# Patient Record
Sex: Female | Born: 1995 | Race: White | Hispanic: No | Marital: Single | Smoking: Current every day smoker
Health system: Southern US, Community
[De-identification: ages and names within clinical notes are randomized; demographics above are authoritative.]

## PROBLEM LIST (undated history)

## (undated) ENCOUNTER — Inpatient Hospital Stay: Payer: Self-pay

## (undated) DIAGNOSIS — R51 Headache: Secondary | ICD-10-CM

## (undated) DIAGNOSIS — N12 Tubulo-interstitial nephritis, not specified as acute or chronic: Secondary | ICD-10-CM

## (undated) DIAGNOSIS — R519 Headache, unspecified: Secondary | ICD-10-CM

## (undated) DIAGNOSIS — O4440 Low lying placenta NOS or without hemorrhage, unspecified trimester: Secondary | ICD-10-CM

## (undated) HISTORY — DX: Low lying placenta nos or without hemorrhage, unspecified trimester: O44.40

## (undated) HISTORY — PX: NO PAST SURGERIES: SHX2092

---
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
## 2017-01-14 ENCOUNTER — Ambulatory Visit (INDEPENDENT_AMBULATORY_CARE_PROVIDER_SITE_OTHER): Payer: BLUE CROSS/BLUE SHIELD | Admitting: Obstetrics and Gynecology

## 2017-01-14 ENCOUNTER — Encounter: Payer: Self-pay | Admitting: Obstetrics and Gynecology

## 2017-01-14 VITALS — BP 118/70 | Ht 60.0 in | Wt 212.0 lb

## 2017-01-14 DIAGNOSIS — Z3046 Encounter for surveillance of implantable subdermal contraceptive: Secondary | ICD-10-CM | POA: Diagnosis not present

## 2017-01-14 NOTE — Progress Notes (Signed)
  GYNECOLOGY PROCEDURE NOTE  Nexplanon removal discussed in detail.  Risks of infection, bleeding, nerve injury all reviewed.  Patient understands risks and desires to proceed.  Verbal consent obtained.  Patient is certain she wants the implanon removed.  All questions answered.  Procedure: Patient placed in dorsal supine with left arm above head, elbow flexed at 90 degrees, arm resting on examination table.  Implanon identified without problems.  Betadine scrub x3.  1 ml of 1% lidocaine injected under Nexplanon device without problems.  Sterile gloves applied.  Small 0.5cm incision made at distal tip of Nexplanon device with 11 blade scalpel.  Nexplanon brought to incision and grasped with a small kelly clamp.  Nexplanon removed intact without problems.  Pressure applied to incision.  Hemostasis obtained.  Steri-strips applied, followed by bandage and compression dressing.  Patient tolerated procedure well.  No complications.   Assessment: 21 y.o. year old female now s/p uncomplicated Nexplanon removal.  Plan: 1.  Patient given post procedure precautions and asked to call for fever, chills, redness or drainage from her incision, bleeding from incision.  She understands she will likely have a small bruise near site of removal and can remove bandage tomorrow and steri-strips in approximately 1 week.  2) Contraception: none. Desires pregnancy.  Discussed PNV and self-care to prepare for pregnancy.  Thomasene MohairStephen Jackson, MD 01/14/2017 12:13 PM

## 2017-04-11 ENCOUNTER — Encounter: Payer: Self-pay | Admitting: Emergency Medicine

## 2017-04-11 ENCOUNTER — Emergency Department
Admission: EM | Admit: 2017-04-11 | Discharge: 2017-04-11 | Disposition: A | Discharge status: Home / Self Care | Payer: BLUE CROSS/BLUE SHIELD | Attending: Emergency Medicine | Admitting: Emergency Medicine

## 2017-04-11 ENCOUNTER — Emergency Department: Payer: BLUE CROSS/BLUE SHIELD

## 2017-04-11 DIAGNOSIS — Y92410 Unspecified street and highway as the place of occurrence of the external cause: Secondary | ICD-10-CM | POA: Insufficient documentation

## 2017-04-11 DIAGNOSIS — Z3A00 Weeks of gestation of pregnancy not specified: Secondary | ICD-10-CM | POA: Insufficient documentation

## 2017-04-11 DIAGNOSIS — Y998 Other external cause status: Secondary | ICD-10-CM | POA: Insufficient documentation

## 2017-04-11 DIAGNOSIS — Z87891 Personal history of nicotine dependence: Secondary | ICD-10-CM | POA: Diagnosis not present

## 2017-04-11 DIAGNOSIS — M7918 Myalgia, other site: Secondary | ICD-10-CM | POA: Diagnosis not present

## 2017-04-11 DIAGNOSIS — Z3491 Encounter for supervision of normal pregnancy, unspecified, first trimester: Secondary | ICD-10-CM

## 2017-04-11 DIAGNOSIS — R51 Headache: Secondary | ICD-10-CM | POA: Diagnosis not present

## 2017-04-11 DIAGNOSIS — S161XXA Strain of muscle, fascia and tendon at neck level, initial encounter: Secondary | ICD-10-CM | POA: Diagnosis not present

## 2017-04-11 DIAGNOSIS — S199XXA Unspecified injury of neck, initial encounter: Secondary | ICD-10-CM | POA: Diagnosis present

## 2017-04-11 DIAGNOSIS — O9A219 Injury, poisoning and certain other consequences of external causes complicating pregnancy, unspecified trimester: Secondary | ICD-10-CM | POA: Diagnosis not present

## 2017-04-11 DIAGNOSIS — Y939 Activity, unspecified: Secondary | ICD-10-CM | POA: Diagnosis not present

## 2017-04-11 DIAGNOSIS — Z3201 Encounter for pregnancy test, result positive: Secondary | ICD-10-CM | POA: Insufficient documentation

## 2017-04-11 LAB — HCG, QUANTITATIVE, PREGNANCY: hCG, Beta Chain, Quant, S: 181 m[IU]/mL — ABNORMAL HIGH (ref <5)

## 2017-04-11 LAB — POCT PREGNANCY, URINE: PREG TEST UR: POSITIVE — AB

## 2017-04-11 NOTE — Discharge Instructions (Signed)
Due to early gestation advised only extra strength Tylenol for pain.
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
## 2017-04-18 ENCOUNTER — Encounter: Payer: BLUE CROSS/BLUE SHIELD | Admitting: Advanced Practice Midwife

## 2017-04-28 ENCOUNTER — Ambulatory Visit (INDEPENDENT_AMBULATORY_CARE_PROVIDER_SITE_OTHER): Payer: BLUE CROSS/BLUE SHIELD | Admitting: Advanced Practice Midwife

## 2017-04-28 ENCOUNTER — Encounter: Payer: Self-pay | Admitting: Advanced Practice Midwife

## 2017-04-28 VITALS — BP 110/72 | HR 85 | Wt 221.0 lb

## 2017-04-28 DIAGNOSIS — Z113 Encounter for screening for infections with a predominantly sexual mode of transmission: Secondary | ICD-10-CM

## 2017-04-28 DIAGNOSIS — O99210 Obesity complicating pregnancy, unspecified trimester: Secondary | ICD-10-CM

## 2017-04-28 DIAGNOSIS — Z1379 Encounter for other screening for genetic and chromosomal anomalies: Secondary | ICD-10-CM

## 2017-04-28 DIAGNOSIS — O0990 Supervision of high risk pregnancy, unspecified, unspecified trimester: Secondary | ICD-10-CM | POA: Insufficient documentation

## 2017-04-28 DIAGNOSIS — Z6841 Body Mass Index (BMI) 40.0 and over, adult: Secondary | ICD-10-CM

## 2017-04-28 NOTE — Progress Notes (Signed)
New Obstetric Patient H&P    Chief Complaint: "Desires prenatal care"   History of Present Illness: Patient is a 21 y.o. G2P1001 Not Hispanic or Latino female, LMP 03/14/2017 presents with amenorrhea and positive home pregnancy test. Based on her  LMP, her EDD is Estimated Date of Delivery: 12/19/2017. and her EGA is 3333w3d. Cycles are 6. days, regular, and occur approximately every : 28 days. She has never had a PAP smear.   She had a urine pregnancy test which was positive 2 or 3 week(s)  ago. Her last menstrual period was normal and lasted for  5 or 6 day(s). Since her LMP she claims she has experienced breast tenderness, fatigue. She denies vaginal bleeding. Her past medical history is contributory. Her prior pregnancies are notable for no complications. She gained 85 pounds with G1  Since her LMP, she admits to the use of tobacco products  no She claims she has gained   9 pounds since the start of her pregnancy.  There are cats in the home in the home  no  She admits close contact with children on a regular basis  yes  She has had chicken pox in the past yes She has had Tuberculosis exposures, symptoms, or previously tested positive for TB   no Current or past history of domestic violence. no  Genetic Screening/Teratology Counseling: (Includes patient, baby's father, or anyone in either family with:)   1. [facility]  no 2. Thalassemia ([place], [place], Mediterranean, or Asian background): MCV<80  no 3. Neural tube defect (meningomyelocele, spina bifida, anencephaly)  no 4. Congenital heart defect  no  5. Down syndrome  no 6. Tay-Sachs (Jewish, [place])  no 7. Canavan's Disease  no 8. Sickle cell disease or trait (African)  no  9. Hemophilia or other blood disorders  no  10. Muscular dystrophy  no  11. Cystic fibrosis  no  12. Huntington's Chorea  no  13. Mental retardation/autism  no 14. Other inherited genetic or chromosomal disorder  no 15. Maternal  metabolic disorder (DM, PKU, etc)  no 16. Patient or FOB with a child with a birth defect not listed above no  16a. Patient or FOB with a birth defect themselves no 17. Recurrent pregnancy loss, or stillbirth  no  18. Any medications since LMP other than prenatal vitamins (include vitamins, supplements, OTC meds, drugs, alcohol)  no 19. Any other genetic/environmental exposure to discuss  no  Infection History:   1. Lives with someone with TB or TB exposed  no  2. Patient or partner has history of genital herpes  no 3. Rash or viral illness since LMP  no 4. History of STI (GC, CT, HPV, syphilis, HIV)  no 5. History of recent travel :  no  Other pertinent information:  no     Review of Systems:10 point review of systems negative unless otherwise noted in HPI  Past Medical History:  Past Medical History:  Diagnosis Date  . Patient denies medical problems     Past Surgical History:  Past Surgical History:  Procedure Laterality Date  . NO PAST SURGERIES      Gynecologic History: Patient's last menstrual period was 03/14/2017 (exact date).  Obstetric History: G2P1001  Family History:  Family History  Problem Relation Age of Onset  . Diabetes Maternal Uncle   . Breast cancer Other     Social History:  Social History   Social History  . Marital status: Single  Spouse name: N/A  . Number of children: N/A  . Years of education: N/A   Occupational History  . Not on file.   Social History Main Topics  . Smoking status: Former Games developer  . Smokeless tobacco: Never Used  . Alcohol use No  . Drug use: No  . Sexual activity: Yes   Other Topics Concern  . Not on file   Social History Narrative  . No narrative on file    Allergies:  No Known Allergies  Medications: Prior to Admission medications   Medication Sig Start Date End Date Taking? Authorizing Provider  acetaminophen (TYLENOL) 325 MG tablet Take 2 tablets (650 mg total) by mouth every 6 (six) hours as  needed for mild pain or fever (or Fever >/= 101). Patient not taking: Reported on 01/14/2017 07/07/16   Altamese Dilling, MD  cefUROXime (CEFTIN) 250 MG tablet Take 1 tablet (250 mg total) by mouth 2 (two) times daily with a meal. Patient not taking: Reported on 01/14/2017 07/07/16   Altamese Dilling, MD    Physical Exam Vitals: Blood pressure 110/72, pulse 85, weight 221 lb (100.2 kg), last menstrual period 03/14/2017.  General: NAD HEENT: normocephalic, anicteric Thyroid: no enlargement, no palpable nodules Pulmonary: No increased work of breathing, CTAB Cardiovascular: RRR, distal pulses 2+ Abdomen: NABS, soft, non-tender, non-distended.  Umbilicus without lesions.  No hepatomegaly, splenomegaly or masses palpable. No evidence of hernia  Genitourinary:  External: Normal external female genitalia.  Normal urethral meatus, normal  Bartholin's and Skene's glands.    Vagina: Normal vaginal mucosa, no evidence of prolapse.    Cervix: Grossly normal in appearance, no bleeding, no CMT  Uterus: Enlarged, mobile, normal contour.    Adnexa: ovaries non-enlarged, no adnexal masses  Rectal: deferred Extremities: no edema, erythema, or tenderness Neurologic: Grossly intact Psychiatric: mood appropriate, affect full   Assessment: 21 y.o. G2P1001 at Unknown presenting to initiate prenatal care  Plan: 1) Avoid alcoholic beverages. 2) Patient encouraged not to smoke.  3) Discontinue the use of all non-medicinal drugs and chemicals.  4) Take prenatal vitamins daily.  5) Nutrition, food safety (fish, cheese advisories, and high nitrite foods) and exercise discussed. 6) Hospital and practice style discussed with cross coverage system.  7) Genetic Screening, such as with 1st Trimester Screening, cell free fetal DNA, AFP testing, and Ultrasound, as well as with amniocentesis and CVS as appropriate, is discussed with patient. At the conclusion of today's visit patient requested genetic  testing 8) Patient is asked about travel to areas at risk for the Zika virus, and counseled to avoid travel and exposure to mosquitoes or sexual partners who may have themselves been exposed to the virus. Testing is discussed, and will be ordered as appropriate.   Tresea Mall, CNM

## 2017-04-28 NOTE — Patient Instructions (Signed)

## 2017-04-28 NOTE — Progress Notes (Signed)
NOB 

## 2017-05-03 LAB — URINE CULTURE

## 2017-05-03 LAB — GC/CHLAMYDIA PROBE AMP
Chlamydia trachomatis, NAA: NEGATIVE
NEISSERIA GONORRHOEAE BY PCR: NEGATIVE

## 2017-05-06 ENCOUNTER — Other Ambulatory Visit: Payer: Self-pay | Admitting: Advanced Practice Midwife

## 2017-05-06 ENCOUNTER — Encounter: Payer: BLUE CROSS/BLUE SHIELD | Admitting: Obstetrics & Gynecology

## 2017-05-06 ENCOUNTER — Other Ambulatory Visit: Payer: BLUE CROSS/BLUE SHIELD

## 2017-05-06 MED ORDER — NITROFURANTOIN MONOHYD MACRO 100 MG PO CAPS: 100.0000 mg | ORAL_CAPSULE | Freq: Two times a day (BID) | ORAL | 0 refills | Status: DC

## 2017-05-06 NOTE — Progress Notes (Signed)
Rx sent to pharmacy to treat UTI Klebsiella Oxytoca >100k

## 2017-05-12 ENCOUNTER — Other Ambulatory Visit: Payer: BLUE CROSS/BLUE SHIELD

## 2017-05-12 ENCOUNTER — Ambulatory Visit (INDEPENDENT_AMBULATORY_CARE_PROVIDER_SITE_OTHER): Payer: BLUE CROSS/BLUE SHIELD

## 2017-05-12 ENCOUNTER — Encounter: Payer: Self-pay | Admitting: Advanced Practice Midwife

## 2017-05-12 ENCOUNTER — Ambulatory Visit (INDEPENDENT_AMBULATORY_CARE_PROVIDER_SITE_OTHER): Payer: BLUE CROSS/BLUE SHIELD | Admitting: Advanced Practice Midwife

## 2017-05-12 VITALS — BP 118/70 | Wt 218.0 lb

## 2017-05-12 DIAGNOSIS — O0990 Supervision of high risk pregnancy, unspecified, unspecified trimester: Secondary | ICD-10-CM

## 2017-05-12 DIAGNOSIS — Z1379 Encounter for other screening for genetic and chromosomal anomalies: Secondary | ICD-10-CM

## 2017-05-12 DIAGNOSIS — Z3A08 8 weeks gestation of pregnancy: Secondary | ICD-10-CM

## 2017-05-12 DIAGNOSIS — Z362 Encounter for other antenatal screening follow-up: Secondary | ICD-10-CM | POA: Diagnosis not present

## 2017-05-12 DIAGNOSIS — Z6841 Body Mass Index (BMI) 40.0 and over, adult: Secondary | ICD-10-CM

## 2017-05-12 DIAGNOSIS — Z113 Encounter for screening for infections with a predominantly sexual mode of transmission: Secondary | ICD-10-CM

## 2017-05-12 DIAGNOSIS — O99210 Obesity complicating pregnancy, unspecified trimester: Secondary | ICD-10-CM

## 2017-05-12 NOTE — Progress Notes (Signed)
  Routine Prenatal Care Visit  Subjective  Nestor Ramplicia D Pun is a 21 y.o. G2P1001 at Unknown being seen today for ongoing prenatal care.  She is currently monitored for the following issues for this high-risk pregnancy and has Sepsis (HCC); Pyelonephritis; Supervision of high risk pregnancy, antepartum; Obesity affecting pregnancy, antepartum; and BMI 40.0-44.9, adult (HCC) on their problem list.  ----------------------------------------------------------------------------------- Patient reports nausea.  Comfort measures reviewed and sample of Bonjesta given.  . Vag. Bleeding: None.   . Denies leaking of fluid.  ----------------------------------------------------------------------------------- The following portions of the patient's history were reviewed and updated as appropriate: allergies, current medications, past family history, past medical history, past social history, past surgical history and problem list. Problem list updated.   Objective  Blood pressure 118/70, weight 218 lb (98.9 kg), last menstrual period 03/14/2017. Pregravid weight 212 lb (96.2 kg) Total Weight Gain 6 lb (2.722 kg) Urinalysis: Urine Protein: Negative Urine Glucose: Negative  Fetal Status:           General:  Alert, oriented and cooperative. Patient is in no acute distress.  Skin: Skin is warm and dry. No rash noted.   Cardiovascular: Normal heart rate noted  Respiratory: Normal respiratory effort, no problems with respiration noted  Abdomen: Soft, gravid, appropriate for gestational age.       Pelvic:  Cervical exam deferred        Extremities: Normal range of motion.     Mental Status: Normal mood and affect. Normal behavior. Normal judgment and thought content.   Assessment   21 y.o. G2P1001 at Unknown by  Not found. presenting for routine prenatal visit  Plan   Pregnancy #2 Problems (from 03/14/17 to present)    No problems associated with this episode.       Preterm labor symptoms and general  obstetric precautions including but not limited to vaginal bleeding, contractions, leaking of fluid and fetal movement were reviewed in detail with the patient. Please refer to After Visit Summary for other counseling recommendations.   Return in about 4 weeks (around 06/09/2017) for 1st trimester screen and rob.  Tresea MallJane Emilee Market, CNM  05/12/2017 10:12 AM

## 2017-05-12 NOTE — Progress Notes (Signed)
Dating scan today, early 1 hr gtt. No vb. No lof

## 2017-05-14 LAB — HEMOGLOBINOPATHY EVALUATION
HEMOGLOBIN A2 QUANTITATION: 2.4 % (ref 1.8–3.2)
HGB C: 0 %
HGB S: 0 %
HGB VARIANT: 0 %
Hemoglobin F Quantitation: 0 % (ref 0.0–2.0)
Hgb A: 97.6 % (ref 96.4–98.8)

## 2017-05-14 LAB — RPR+RH+ABO+RUB AB+AB SCR+CB...
ANTIBODY SCREEN: NEGATIVE
HEMATOCRIT: 39 % (ref 34.0–46.6)
HEMOGLOBIN: 13.4 g/dL (ref 11.1–15.9)
HEP B S AG: NEGATIVE
HIV SCREEN 4TH GENERATION: NONREACTIVE
MCH: 30.9 pg (ref 26.6–33.0)
MCHC: 34.4 g/dL (ref 31.5–35.7)
MCV: 90 fL (ref 79–97)
Platelets: 239 10*3/uL (ref 150–379)
RBC: 4.34 x10E6/uL (ref 3.77–5.28)
RDW: 13.9 % (ref 12.3–15.4)
RH TYPE: POSITIVE
RPR: NONREACTIVE
Rubella Antibodies, IGG: 3.29 index (ref >0.99)
Varicella zoster IgG: 555 index (ref >165)
WBC: 5.4 10*3/uL (ref 3.4–10.8)

## 2017-05-14 LAB — GLUCOSE, 1 HOUR GESTATIONAL: GESTATIONAL DIABETES SCREEN: 83 mg/dL (ref 65–139)

## 2017-06-02 ENCOUNTER — Ambulatory Visit (INDEPENDENT_AMBULATORY_CARE_PROVIDER_SITE_OTHER): Payer: BLUE CROSS/BLUE SHIELD | Admitting: Obstetrics & Gynecology

## 2017-06-02 ENCOUNTER — Encounter: Payer: Self-pay | Admitting: Obstetrics & Gynecology

## 2017-06-02 ENCOUNTER — Other Ambulatory Visit: Payer: BLUE CROSS/BLUE SHIELD

## 2017-06-02 VITALS — BP 120/70 | HR 79 | Ht 60.0 in | Wt 224.0 lb

## 2017-06-02 DIAGNOSIS — Z3A11 11 weeks gestation of pregnancy: Secondary | ICD-10-CM

## 2017-06-02 DIAGNOSIS — Z1379 Encounter for other screening for genetic and chromosomal anomalies: Secondary | ICD-10-CM

## 2017-06-02 NOTE — Progress Notes (Signed)
  Subjective  Fetal Movement? no Contractions? no Leaking Fluid? no Vaginal Bleeding? no  Objective  BP 120/70   Pulse 79   Ht 5' (1.524 m)   Wt 224 lb (101.6 kg)   LMP 03/14/2017 (Exact Date)   BMI 43.75 kg/m  General: NAD Pumonary: no increased work of breathing Abdomen: gravid, non-tender Extremities: no edema Psychiatric: mood appropriate, affect full No FHT today.  US this week Assessment  21 y.o. G2P1001 at 4976w3d by  12/19/2017, by Last Menstrual Period presenting for routine prenatal visit  Plan   Problem List Items Addressed This Visit    None    Visit Diagnoses    [redacted] weeks gestation of pregnancy    -  Primary   Encounter for genetic screening for Down Syndrome        Plan First Screen US and Labs this week (no staff today for it)  Annamarie MajorPaul Jolan Upchurch, MD, Merlinda FrederickFACOG [facility], [facility] 06/02/2017  4:36 PM

## 2017-06-05 ENCOUNTER — Other Ambulatory Visit: Payer: Self-pay | Admitting: Obstetrics & Gynecology

## 2017-06-05 DIAGNOSIS — Z3682 Encounter for antenatal screening for nuchal translucency: Secondary | ICD-10-CM

## 2017-06-09 ENCOUNTER — Other Ambulatory Visit: Payer: BLUE CROSS/BLUE SHIELD

## 2017-06-09 ENCOUNTER — Encounter: Payer: BLUE CROSS/BLUE SHIELD | Admitting: Maternal Newborn

## 2017-06-16 ENCOUNTER — Encounter: Payer: Self-pay | Admitting: Obstetrics and Gynecology

## 2017-06-16 ENCOUNTER — Ambulatory Visit (INDEPENDENT_AMBULATORY_CARE_PROVIDER_SITE_OTHER): Payer: BLUE CROSS/BLUE SHIELD | Admitting: Obstetrics and Gynecology

## 2017-06-16 ENCOUNTER — Ambulatory Visit (INDEPENDENT_AMBULATORY_CARE_PROVIDER_SITE_OTHER): Payer: BLUE CROSS/BLUE SHIELD

## 2017-06-16 VITALS — BP 122/80 | Wt 221.0 lb

## 2017-06-16 DIAGNOSIS — O0990 Supervision of high risk pregnancy, unspecified, unspecified trimester: Secondary | ICD-10-CM

## 2017-06-16 DIAGNOSIS — Z1379 Encounter for other screening for genetic and chromosomal anomalies: Secondary | ICD-10-CM

## 2017-06-16 DIAGNOSIS — Z3A13 13 weeks gestation of pregnancy: Secondary | ICD-10-CM

## 2017-06-16 DIAGNOSIS — Z6841 Body Mass Index (BMI) 40.0 and over, adult: Secondary | ICD-10-CM

## 2017-06-16 DIAGNOSIS — Z3682 Encounter for antenatal screening for nuchal translucency: Secondary | ICD-10-CM | POA: Diagnosis not present

## 2017-06-16 DIAGNOSIS — O99210 Obesity complicating pregnancy, unspecified trimester: Secondary | ICD-10-CM

## 2017-06-16 NOTE — Progress Notes (Signed)
  Routine Prenatal Care Visit Subjective  Amy Ware is a 21 y.o. G2P1001 at [redacted]w[redacted]d being seen today for ongoing prenatal care.  She is currently monitored for the following issues for this high-risk pregnancy and has Sepsis (HCC); Pyelonephritis; Supervision of high risk pregnancy, antepartum; Obesity affecting pregnancy, antepartum; and BMI 40.0-44.9, adult (HCC) on their problem list.  ----------------------------------------------------------------------------------- Patient reports no complaints.    . Vag. Bleeding: None.   . Denies leaking of fluid.  NT u/s today.  Reiterated recommendation for bASA.  ----------------------------------------------------------------------------------- The following portions of the patient's history were reviewed and updated as appropriate: allergies, current medications, past family history, past medical history, past social history, past surgical history and problem list. Problem list updated.  Objective  Blood pressure 122/80, weight 221 lb (100.2 kg), last menstrual period 03/14/2017. Pregravid weight 212 lb (96.2 kg) Total Weight Gain 9 lb (4.082 kg) Urinalysis: Urine Protein: Negative Urine Glucose: Negative  Fetal Status: Fetal Heart Rate (bpm): Present         General:  Alert, oriented and cooperative. Patient is in no acute distress.  Skin: Skin is warm and dry. No rash noted.   Cardiovascular: Normal heart rate noted  Respiratory: Normal respiratory effort, no problems with respiration noted  Abdomen: Soft, gravid, appropriate for gestational age. Pain/Pressure: Absent     Pelvic:  Cervical exam deferred        Extremities: Normal range of motion.     Mental Status: Normal mood and affect. Normal behavior. Normal judgment and thought content.   Assessment   21 y.o. G2P1001 at [redacted]w[redacted]d by  12/19/2017, by Last Menstrual Period presenting for routine prenatal visit  Plan   Pregnancy #2 Problems (from 03/14/17 to present)    Problem Noted  Resolved   Supervision of high risk pregnancy, antepartum 04/28/2017 by Tresea MallGledhill, Jane, CNM No   Overview Addendum 06/16/2017  2:16 PM by Conard NovakJackson, Camren Lipsett D, MD    Clinic [facility] Prenatal Labs  Dating  Blood type: O/Positive/-- (11/12 1011)   Genetic Screen 1 Screen:    AFP:     Quad:     NIPS: Antibody:Negative (11/12 1011)  Anatomic US  Rubella: 3.29 (11/12 1011) Varicella: @VZVIGG @  GTT Early:               Third trimester:  RPR: Non Reactive (11/12 1011)   Rhogam  HBsAg: Negative (11/12 1011)   TDaP vaccine                       Flu Shot: HIV:     Baby Food                                GBS:   Contraception  Pap:  CBB     CS/VBAC NA   Support Person Partner: Javon        BMI 40.0-44.9, adult (HCC) 04/28/2017 by Tresea MallGledhill, Jane, CNM No     Please refer to After Visit Summary for other counseling recommendations.   Return in about 4 weeks (around 07/14/2017) for Routine Prenatal Appointment.  Thomasene MohairStephen Elija Mccamish, MD  06/16/2017 2:24 PM

## 2017-06-19 LAB — FIRST TRIMESTER SCREEN W/NT
CRL: 72.7 mm
DIA MoM: 2.07
DIA Value: 361.5 pg/mL
Gest Age-Collect: 13.1 weeks
HCG MOM: 0.97
HCG VALUE: 61.1 [IU]/mL
MATERNAL AGE AT EDD: 20 a
NUCHAL TRANSLUCENCY: 2 mm
NUMBER OF FETUSES: 1
Nuchal Translucency MoM: 1.12
PAPP-A MoM: 1.74
PAPP-A Value: 1346.7 ng/mL
Test Results:: NEGATIVE
Weight: 221 [lb_av]

## 2017-06-30 ENCOUNTER — Encounter: Payer: BLUE CROSS/BLUE SHIELD | Admitting: Obstetrics and Gynecology

## 2017-07-01 NOTE — L&D Delivery Note (Signed)
Delivery Note - VAVD Primary OB: [facility] Physician: Amy MajorPaul Maleek Craver, MD Gestational Age: Full term Antepartum complications: Preterm labor, s/p betamethasone; now full term Intrapartum complications: None  A viable Female was delivered via vertex presentation by Vacuum Extraction    One application of flat Kiwi for outlet delivery; one ctx and pull and baby was easily delivered.  Vacuum removed and remainder of baby delivered without difficulty.   No pop-offs. Apgars:7 ,8  Weight:  6 lb 8 oz .   Placenta status: spontaneous and Intact.  Cord: 3+ vessels;  with the following complications: none.  Anesthesia:  epidural Episiotomy:  none Lacerations:  periuretheral Suture Repair: 3.0 chromic Est. Blood Loss (mL):  less than 100 mL  Mom to postpartum.  Baby to Couplet care / Skin to Skin.  Amy MajorPaul Essie Gehret, MD, Merlinda FrederickFACOG [facility], [facility] 12/09/2017  8:31 AM [phone]

## 2017-07-14 ENCOUNTER — Ambulatory Visit (INDEPENDENT_AMBULATORY_CARE_PROVIDER_SITE_OTHER): Payer: Medicaid Other | Admitting: Obstetrics and Gynecology

## 2017-07-14 VITALS — BP 112/60 | Wt 219.0 lb

## 2017-07-14 DIAGNOSIS — O0990 Supervision of high risk pregnancy, unspecified, unspecified trimester: Secondary | ICD-10-CM

## 2017-07-14 DIAGNOSIS — Z3A17 17 weeks gestation of pregnancy: Secondary | ICD-10-CM

## 2017-07-14 DIAGNOSIS — Z363 Encounter for antenatal screening for malformations: Secondary | ICD-10-CM

## 2017-07-14 DIAGNOSIS — Z6841 Body Mass Index (BMI) 40.0 and over, adult: Secondary | ICD-10-CM

## 2017-07-14 DIAGNOSIS — O99210 Obesity complicating pregnancy, unspecified trimester: Secondary | ICD-10-CM

## 2017-07-14 NOTE — Progress Notes (Signed)
ROB today no concerns anatomy scan next visit

## 2017-07-14 NOTE — Progress Notes (Signed)
ROB

## 2017-08-04 ENCOUNTER — Ambulatory Visit (INDEPENDENT_AMBULATORY_CARE_PROVIDER_SITE_OTHER): Payer: Medicaid Other | Admitting: Obstetrics and Gynecology

## 2017-08-04 ENCOUNTER — Ambulatory Visit (INDEPENDENT_AMBULATORY_CARE_PROVIDER_SITE_OTHER): Payer: Medicaid Other

## 2017-08-04 VITALS — BP 122/60 | Wt 222.0 lb

## 2017-08-04 DIAGNOSIS — Z6841 Body Mass Index (BMI) 40.0 and over, adult: Secondary | ICD-10-CM

## 2017-08-04 DIAGNOSIS — O99210 Obesity complicating pregnancy, unspecified trimester: Secondary | ICD-10-CM

## 2017-08-04 DIAGNOSIS — Z3A17 17 weeks gestation of pregnancy: Secondary | ICD-10-CM

## 2017-08-04 DIAGNOSIS — O4440 Low lying placenta NOS or without hemorrhage, unspecified trimester: Secondary | ICD-10-CM

## 2017-08-04 DIAGNOSIS — O0990 Supervision of high risk pregnancy, unspecified, unspecified trimester: Secondary | ICD-10-CM

## 2017-08-04 DIAGNOSIS — Z3A20 20 weeks gestation of pregnancy: Secondary | ICD-10-CM

## 2017-08-04 DIAGNOSIS — Z363 Encounter for antenatal screening for malformations: Secondary | ICD-10-CM

## 2017-08-04 HISTORY — DX: Low lying placenta nos or without hemorrhage, unspecified trimester: O44.40

## 2017-08-04 NOTE — Progress Notes (Signed)
ROB °N&V  °Anatomy scan today °

## 2017-08-05 NOTE — Progress Notes (Signed)
Routine Prenatal Care Visit  Subjective  Amy Ware is a 22 y.o. G2P1001 at 337w4d being seen today for ongoing prenatal care.  She is currently monitored for the following issues for this high-risk pregnancy and has Sepsis (HCC); Pyelonephritis; Supervision of high risk pregnancy, antepartum; Obesity affecting pregnancy, antepartum; BMI 40.0-44.9, adult (HCC); and Low-lying placenta on their problem list.  ----------------------------------------------------------------------------------- Patient reports no complaints.   Contractions: Not present. Vag. Bleeding: None.  Movement: Present. Denies leaking of fluid.  ----------------------------------------------------------------------------------- The following portions of the patient's history were reviewed and updated as appropriate: allergies, current medications, past family history, past medical history, past social history, past surgical history and problem list. Problem list updated.   Objective  Blood pressure 122/60, weight 222 lb (100.7 kg), last menstrual period 03/14/2017. Pregravid weight 212 lb (96.2 kg) Total Weight Gain 10 lb (4.536 kg) Urinalysis: Urine Protein: Negative Urine Glucose: Negative  Fetal Status: Fetal Heart Rate (bpm): 145   Movement: Present     General:  Alert, oriented and cooperative. Patient is in no acute distress.  Skin: Skin is warm and dry. No rash noted.   Cardiovascular: Normal heart rate noted  Respiratory: Normal respiratory effort, no problems with respiration noted  Abdomen: Soft, gravid, appropriate for gestational age. Pain/Pressure: Absent     Pelvic:  Cervical exam deferred        Extremities: Normal range of motion.     ental Status: Normal mood and affect. Normal behavior. Normal judgment and thought content.   [place]ore[place] Ob Comp + 14 Wk  Result Date: 08/05/2017 ULTRASOUND REPORT Location: [facility] Date of Service: 08/04/2017 Indications:Anatomy U/S Findings: Mason JimSingleton intrauterine  pregnancy is visualized with FHR at 135 BPM. Biometrics give an (U/S) Gestational age of 6535w6d and an (U/S) EDD of 12/16/2017; this correlates with the clinically established Estimated Date of Delivery: 12/19/17 Fetal presentation is Cephalic. EFW: 13 oz. Placenta: Anterior, marginal previa, grade 1 (1.07cm from internal cx os). AFI: subjectively normal. Anatomic survey is complete and normal; Gender - surprise.  Right Ovary is normal in appearance. Left Ovary is normal appearance. Survey of the adnexa demonstrates no adnexal masses. There is no free peritoneal fluid in the cul de sac. Impression: 1. 3985w3d Viable Singleton Intrauterine pregnancy by U/S. 2. (U/S) EDD is consistent with Clinically established Estimated Date of Delivery: 12/19/17 . 3. Normal Anatomy Scan Recommendations: 1.Clinical correlation with the patient's History and Physical Exam. 2. Follow up at 28 week to check placenta location. Willette AlmaKristen Priestley, RDMS, RVT  There is a singleton gestation with subjectively normal amniotic fluid volume. The fetal biometry correlates with established dating. Detailed evaluation of the fetal anatomy was performed.The fetal anatomical survey appears within normal limits within the resolution of ultrasound as described above.  It must be noted that a normal ultrasound is unable to rule out fetal aneuploidy.  Vena AustriaAndreas Keyonte Cookston, MD [facility], [facility]   Assessment   22 y.o. G2P1001 at 797w4d by  12/19/2017, by Last Menstrual Period presenting for routine prenatal visit  Plan   Pregnancy #2 Problems (from 03/14/17 to present)    Problem Noted Resolved   Low-lying placenta 08/04/2017 by Vena AustriaStaebler, Ascencion Coye, MD No   Supervision of high risk pregnancy, antepartum 04/28/2017 by Tresea MallGledhill, Jane, CNM No   Overview Addendum 06/19/2017  2:19 PM by Conard NovakJackson, Stephen D, MD    Clinic [facility] Prenatal Labs  Dating  Blood type: O/Positive/-- (11/12 1011)   Genetic Screen 1 Screen: neg  AFP:       Antibody:Negative (11/12 1011)  Anatomic [place]  Rubella: 3.29 (11/12 1011) Varicella: @VZVIGG @  GTT Early:               Third trimester:  RPR: Non Reactive (11/12 1011)   Rhogam  HBsAg: Negative (11/12 1011)   TDaP vaccine                       Flu Shot: HIV:     Baby Food                                GBS:   Contraception  Pap:  CBB     CS/VBAC NA   Support Person Partner: Javon          BMI 40.0-44.9, adult (HCC) 04/28/2017 by Tresea Mall, CNM No       Preterm labor symptoms and general obstetric precautions including but not limited to vaginal bleeding, contractions, leaking of fluid and fetal movement were reviewed in detail with the patient. Please refer to After Visit Summary for other counseling recommendations.  - Low lying placenta follow up at 28 weeks with 28 week growth scan  Return in about 4 weeks (around 09/01/2017) for ROB.

## 2017-09-01 ENCOUNTER — Encounter: Payer: Self-pay | Admitting: Maternal Newborn

## 2017-09-01 ENCOUNTER — Ambulatory Visit (INDEPENDENT_AMBULATORY_CARE_PROVIDER_SITE_OTHER): Payer: Medicaid Other | Admitting: Maternal Newborn

## 2017-09-01 VITALS — BP 130/70 | Wt 225.0 lb

## 2017-09-01 DIAGNOSIS — O4440 Low lying placenta NOS or without hemorrhage, unspecified trimester: Secondary | ICD-10-CM

## 2017-09-01 DIAGNOSIS — Z3A24 24 weeks gestation of pregnancy: Secondary | ICD-10-CM

## 2017-09-01 DIAGNOSIS — Z3689 Encounter for other specified antenatal screening: Secondary | ICD-10-CM

## 2017-09-01 DIAGNOSIS — O0990 Supervision of high risk pregnancy, unspecified, unspecified trimester: Secondary | ICD-10-CM

## 2017-09-01 NOTE — Progress Notes (Signed)
    Routine Prenatal Care Visit  Subjective  Amy Ware is a 22 y.o. G2P1001 at 3767w3d being seen today for ongoing prenatal care.  She is currently monitored for the following issues for this high-risk pregnancy and has Sepsis (HCC); Pyelonephritis; Supervision of high risk pregnancy, antepartum; Obesity affecting pregnancy, antepartum; BMI 40.0-44.9, adult (HCC); and Low-lying placenta on their problem list.  ----------------------------------------------------------------------------------- Patient reports no complaints.   Contractions: Not present. Vag. Bleeding: None.  Movement: Present. Denies leaking of fluid.  ----------------------------------------------------------------------------------- The following portions of the patient's history were reviewed and updated as appropriate: allergies, current medications, past family history, past medical history, past social history, past surgical history and problem list. Problem list updated.   Objective  Blood pressure 130/70, weight 225 lb (102.1 kg), last menstrual period 03/14/2017. Pregravid weight 212 lb (96.2 kg) Total Weight Gain 13 lb (5.897 kg) Urinalysis:      Fetal Status: Fetal Heart Rate (bpm): 143 Fundal Height: 25 cm Movement: Present     General:  Alert, oriented and cooperative. Patient is in no acute distress.  Skin: Skin is warm and dry. No rash noted.   Cardiovascular: Normal heart rate noted  Respiratory: Normal respiratory effort, no problems with respiration noted  Abdomen: Soft, gravid, appropriate for gestational age. Pain/Pressure: Absent     Pelvic:  Cervical exam deferred        Extremities: Normal range of motion.     Mental Status: Normal mood and affect. Normal behavior. Normal judgment and thought content.     Assessment   22 y.o. G2P1001 at 4867w3d, EDD 12/19/2017 by Last Menstrual Period presenting for routine prenatal visit.  Plan   Pregnancy #2 Problems (from 03/14/17 to present)    Problem  Noted Resolved   Low-lying placenta 08/04/2017 by Vena AustriaStaebler, Andreas, MD No   Supervision of high risk pregnancy, antepartum 04/28/2017 by Tresea MallGledhill, Jane, CNM No   Overview Addendum 08/05/2017  1:10 PM by Vena AustriaStaebler, Andreas, MD    Clinic [facility] Prenatal Labs  Dating  Blood type: O/Positive/-- (11/12 1011)   Genetic Screen 1 Screen: neg    AFP:      Antibody:Negative (11/12 1011)  Anatomic US Low lying placenta [ ]  24 week follow up Rubella: 3.29 (11/12 1011) Varicella: @VZVIGG @  GTT Early:               Third trimester:  RPR: Non Reactive (11/12 1011)   Rhogam  HBsAg: Negative (11/12 1011)   TDaP vaccine                       Flu Shot: HIV:     Baby Food                                GBS:   Contraception  Pap:  CBB     CS/VBAC NA   Support Person Partner: Javon          BMI 40.0-44.9, adult (HCC) 04/28/2017 by Tresea MallGledhill, Jane, CNM No       Preterm labor symptoms and general obstetric precautions including but not limited to vaginal bleeding, contractions, leaking of fluid and fetal movement were reviewed in detail with the patient.  Return in about 4 weeks (around 09/29/2017) for ROB with GTT/28 week labs and ultrasound.  Marcelyn BruinsJacelyn Robertha Staples, CNM 09/01/2017  4:50 PM

## 2017-09-01 NOTE — Progress Notes (Signed)
C/o nose bleeds c coughing or vomiting.rj

## 2017-09-29 ENCOUNTER — Encounter: Payer: Self-pay | Admitting: Maternal Newborn

## 2017-09-29 ENCOUNTER — Ambulatory Visit (INDEPENDENT_AMBULATORY_CARE_PROVIDER_SITE_OTHER): Payer: Medicaid Other | Admitting: Maternal Newborn

## 2017-09-29 ENCOUNTER — Other Ambulatory Visit: Payer: Medicaid Other

## 2017-09-29 ENCOUNTER — Ambulatory Visit (INDEPENDENT_AMBULATORY_CARE_PROVIDER_SITE_OTHER): Payer: Medicaid Other

## 2017-09-29 VITALS — BP 122/62 | Wt 225.0 lb

## 2017-09-29 DIAGNOSIS — Z3689 Encounter for other specified antenatal screening: Secondary | ICD-10-CM

## 2017-09-29 DIAGNOSIS — Z3A28 28 weeks gestation of pregnancy: Secondary | ICD-10-CM

## 2017-09-29 DIAGNOSIS — O0990 Supervision of high risk pregnancy, unspecified, unspecified trimester: Secondary | ICD-10-CM

## 2017-09-29 DIAGNOSIS — O4443 Low lying placenta NOS or without hemorrhage, third trimester: Secondary | ICD-10-CM

## 2017-09-29 DIAGNOSIS — O4440 Low lying placenta NOS or without hemorrhage, unspecified trimester: Secondary | ICD-10-CM

## 2017-09-29 NOTE — Progress Notes (Addendum)
    Routine Prenatal Care Visit  Subjective  Amy Ware is a 22 y.o. G2P1001 at 5775w3d being seen today for ongoing prenatal care.  She is currently monitored for the following issues for this high-risk pregnancy and has Sepsis (HCC); Pyelonephritis; Supervision of high risk pregnancy, antepartum; Obesity affecting pregnancy, antepartum; BMI 40.0-44.9, adult (HCC); and Low-lying placenta on their problem list.  ----------------------------------------------------------------------------------- Patient reports backache.   Contractions: Not present. Vag. Bleeding: None.  Movement: Present. Denies leaking of fluid.  ----------------------------------------------------------------------------------- The following portions of the patient's history were reviewed and updated as appropriate: allergies, current medications, past family history, past medical history, past social history, past surgical history and problem list. Problem list updated.   Objective  Last menstrual period 03/14/2017. Pregravid weight 212 lb (96.2 kg) Total Weight Gain 13 lb (5.897 kg) Urinalysis: Urine Protein: Negative Urine Glucose: Negative  Fetal Status: Fetal Heart Rate (bpm): 135 Fundal Height: 29 cm Movement: Present     General:  Alert, oriented and cooperative. Patient is in no acute distress.  Skin: Skin is warm and dry. No rash noted.   Cardiovascular: Normal heart rate noted  Respiratory: Normal respiratory effort, no problems with respiration noted  Abdomen: Soft, gravid, appropriate for gestational age. Pain/Pressure: Absent     Pelvic:  Cervical exam deferred        Extremities: Normal range of motion.     Mental Status: Normal mood and affect. Normal behavior. Normal judgment and thought content.     Assessment   22 y.o. G2P1001 at 7675w3d, EDD 12/19/2017 by Last Menstrual Period presenting for routine prenatal visit.  Plan   Pregnancy #2 Problems (from 03/14/17 to present)    Problem Noted  Resolved   Low-lying placenta 08/04/2017 by Vena AustriaStaebler, Andreas, MD No   Supervision of high risk pregnancy, antepartum 04/28/2017 by Tresea MallGledhill, Jane, CNM No   Overview Addendum 08/05/2017  1:10 PM by Vena AustriaStaebler, Andreas, MD    [facility] Prenatal Labs  Dating  Blood type: O/Positive/-- (11/12 1011)   Genetic Screen 1 Screen: neg    AFP:      Antibody:Negative (11/12 1011)  Anatomic US Low lying placenta [ ]  24 week follow up Rubella: 3.29 (11/12 1011) Varicella: @VZVIGG @  GTT Early:               Third trimester:  RPR: Non Reactive (11/12 1011)   Rhogam  HBsAg: Negative (11/12 1011)   TDaP vaccine                       Flu Shot: HIV:     Baby Food                                GBS:   Contraception  Pap:  CBB     CS/VBAC NA   Support Person Partner: Javon          BMI 40.0-44.9, adult (HCC) 04/28/2017 by Tresea MallGledhill, Jane, CNM No      Ultrasound today shows placenta 6.78 cm from cervical os, low-lying placenta resolved. Growth at 52nd %tile.  GTT and 28 week labs today.  Discussed comfort measures for back pain, including brief application of heating pad, muscle rubs, pregnancy support band.  Preterm labor symptoms and general obstetric precautions iwere reviewed with the patient.  Return in about 2 weeks (around 10/13/2017) for ROB.  Marcelyn BruinsJacelyn Nithin Demeo, CNM 09/29/2017  10:09 AM

## 2017-09-29 NOTE — Progress Notes (Signed)
28 wk labs today, growth u/s with placenta f/u. Pt c/o back pain.

## 2017-09-30 LAB — 28 WEEK RH+PANEL
Basophils Absolute: 0 10*3/uL (ref 0.0–0.2)
Basos: 0 %
EOS (ABSOLUTE): 0 10*3/uL (ref 0.0–0.4)
Eos: 1 %
Gestational Diabetes Screen: 136 mg/dL (ref 65–139)
HEMATOCRIT: 34 % (ref 34.0–46.6)
HEMOGLOBIN: 11.4 g/dL (ref 11.1–15.9)
HIV SCREEN 4TH GENERATION: NONREACTIVE
Immature Grans (Abs): 0 10*3/uL (ref 0.0–0.1)
Immature Granulocytes: 0 %
LYMPHS ABS: 1.6 10*3/uL (ref 0.7–3.1)
Lymphs: 20 %
MCH: 29.7 pg (ref 26.6–33.0)
MCHC: 33.5 g/dL (ref 31.5–35.7)
MCV: 89 fL (ref 79–97)
MONOCYTES: 4 %
Monocytes Absolute: 0.3 10*3/uL (ref 0.1–0.9)
NEUTROS ABS: 5.8 10*3/uL (ref 1.4–7.0)
Neutrophils: 75 %
PLATELETS: 167 10*3/uL (ref 150–379)
RBC: 3.84 x10E6/uL (ref 3.77–5.28)
RDW: 13.6 % (ref 12.3–15.4)
RPR: NONREACTIVE
WBC: 7.8 10*3/uL (ref 3.4–10.8)

## 2017-10-13 ENCOUNTER — Ambulatory Visit (INDEPENDENT_AMBULATORY_CARE_PROVIDER_SITE_OTHER): Payer: Medicaid Other | Admitting: Maternal Newborn

## 2017-10-13 ENCOUNTER — Encounter: Payer: Self-pay | Admitting: Maternal Newborn

## 2017-10-13 VITALS — BP 124/70 | Wt 229.0 lb

## 2017-10-13 DIAGNOSIS — Z3A30 30 weeks gestation of pregnancy: Secondary | ICD-10-CM

## 2017-10-13 DIAGNOSIS — Z6841 Body Mass Index (BMI) 40.0 and over, adult: Secondary | ICD-10-CM

## 2017-10-13 DIAGNOSIS — Z23 Encounter for immunization: Secondary | ICD-10-CM | POA: Diagnosis not present

## 2017-10-13 DIAGNOSIS — O0990 Supervision of high risk pregnancy, unspecified, unspecified trimester: Secondary | ICD-10-CM

## 2017-10-13 DIAGNOSIS — Z3689 Encounter for other specified antenatal screening: Secondary | ICD-10-CM

## 2017-10-13 MED ORDER — TETANUS-DIPHTH-ACELL PERTUSSIS 5-2.5-18.5 LF-MCG/0.5 IM SUSP
0.5000 mL | Freq: Once | INTRAMUSCULAR | Status: AC
  Administered 2017-10-13: 0.5 mL via INTRAMUSCULAR

## 2017-10-13 NOTE — Progress Notes (Signed)
    Routine Prenatal Care Visit  Subjective  Amy Ware is a 22 y.o. G2P1001 at 369w3d being seen today for ongoing prenatal care.  She is currently monitored for the following issues for this high-risk pregnancy and has Sepsis (HCC); Pyelonephritis; Supervision of high risk pregnancy, antepartum; Obesity affecting pregnancy, antepartum; and BMI 40.0-44.9, adult (HCC) on their problem list.  ----------------------------------------------------------------------------------- Patient reports no complaints.   Contractions: Not present. Vag. Bleeding: None.  Movement: Present. Denies leaking of fluid.  ----------------------------------------------------------------------------------- The following portions of the patient's history were reviewed and updated as appropriate: allergies, current medications, past family history, past medical history, past social history, past surgical history and problem list. Problem list updated.   Objective  Blood pressure 124/70, weight 229 lb (103.9 kg), last menstrual period 03/14/2017. Pregravid weight 212 lb (96.2 kg) Total Weight Gain 17 lb (7.711 kg) Urinalysis: Urine Protein: Negative Urine Glucose: Negative  Fetal Status: Fetal Heart Rate (bpm): 142 Fundal Height: 31 cm Movement: Present     General:  Alert, oriented and cooperative. Patient is in no acute distress.  Skin: Skin is warm and dry. No rash noted.   Cardiovascular: Normal heart rate noted  Respiratory: Normal respiratory effort, no problems with respiration noted  Abdomen: Soft, gravid, appropriate for gestational age. Pain/Pressure: Absent     Pelvic:  Cervical exam deferred        Extremities: Normal range of motion.  Edema: None  Mental Status: Normal mood and affect. Normal behavior. Normal judgment and thought content.     Assessment   22 y.o. G2P1001 at 2369w3d, EDD 12/19/2017 by Last Menstrual Period presenting for routine prenatal visit.  Plan   Pregnancy #2 Problems (from  03/14/17 to present)    Problem Noted Resolved   Supervision of high risk pregnancy, antepartum 04/28/2017 by Tresea MallGledhill, Jane, CNM No   Overview Addendum 09/30/2017  1:17 PM by Oswaldo ConroySchmid, Jacelyn Y, CNM    [facility] Prenatal Labs  Dating  Blood type: O/Positive/-- (11/12 1011)   Genetic Screen 1 Screen: neg    AFP:      Antibody:Negative (11/12 1011)  Anatomic US Low lying placenta: resolved at 28 weeks Rubella: 3.29 (11/12 1011) Varicella: Immune  GTT Third trimester: 136 RPR: Non Reactive (11/12 1011)   Rhogam  HBsAg: Negative (11/12 1011)   TDaP vaccine                       Flu Shot: HIV:     Baby Food                                GBS:   Contraception  Pap:  CBB     CS/VBAC NA   Support Person Partner: Javon          BMI 40.0-44.9, adult (HCC) 04/28/2017 by Tresea MallGledhill, Jane, CNM No   Low-lying placenta 08/04/2017 by Vena AustriaStaebler, Andreas, MD 09/29/2017 by Oswaldo ConroySchmid, Jacelyn Y, CNM      TDaP today.  Preterm labor symptoms and general obstetric precautions were reviewed.  Please refer to After Visit Summary for other counseling recommendations.   Return in about 2 weeks (around 10/27/2017) for ROB and growth scan.  Marcelyn BruinsJacelyn Schmid, CNM 10/13/2017  4:41 PM

## 2017-10-13 NOTE — Patient Instructions (Signed)
Third Trimester of Pregnancy The third trimester is from week 28 through week 40 (months 7 through 9). The third trimester is a time when the unborn baby (fetus) is growing rapidly. At the end of the ninth month, the fetus is about 20 inches in length and weighs 6-10 pounds. Body changes during your third trimester Your body will continue to go through many changes during pregnancy. The changes vary from woman to woman. During the third trimester:  Your weight will continue to increase. You can expect to gain 25-35 pounds (11-16 kg) by the end of the pregnancy.  You may begin to get stretch marks on your hips, abdomen, and breasts.  You may urinate more often because the fetus is moving lower into your pelvis and pressing on your bladder.  You may develop or continue to have heartburn. This is caused by increased hormones that slow down muscles in the digestive tract.  You may develop or continue to have constipation because increased hormones slow digestion and cause the muscles that push waste through your intestines to relax.  You may develop hemorrhoids. These are swollen veins (varicose veins) in the rectum that can itch or be painful.  You may develop swollen, bulging veins (varicose veins) in your legs.  You may have increased body aches in the pelvis, back, or thighs. This is due to weight gain and increased hormones that are relaxing your joints.  You may have changes in your hair. These can include thickening of your hair, rapid growth, and changes in texture. Some women also have hair loss during or after pregnancy, or hair that feels dry or thin. Your hair will most likely return to normal after your baby is born.  Your breasts will continue to grow and they will continue to become tender. A yellow fluid (colostrum) may leak from your breasts. This is the first milk you are producing for your baby.  Your belly button may stick out.  You may notice more swelling in your hands,  face, or ankles.  You may have increased tingling or numbness in your hands, arms, and legs. The skin on your belly may also feel numb.  You may feel short of breath because of your expanding uterus.  You may have more problems sleeping. This can be caused by the size of your belly, increased need to urinate, and an increase in your body's metabolism.  You may notice the fetus "dropping," or moving lower in your abdomen (lightening).  You may have increased vaginal discharge.  You may notice your joints feel loose and you may have pain around your pelvic bone.  What to expect at prenatal visits You will have prenatal exams every 2 weeks until week 36. Then you will have weekly prenatal exams. During a routine prenatal visit:  You will be weighed to make sure you and the baby are growing normally.  Your blood pressure will be taken.  Your abdomen will be measured to track your baby's growth.  The fetal heartbeat will be listened to.  Any test results from the previous visit will be discussed.  You may have a cervical check near your due date to see if your cervix has softened or thinned (effaced).  You will be tested for Group B streptococcus. This happens between 35 and 37 weeks.  Your health care provider may ask you:  What your birth plan is.  How you are feeling.  If you are feeling the baby move.  If you have had   any abnormal symptoms, such as leaking fluid, bleeding, severe headaches, or abdominal cramping.  If you are using any tobacco products, including cigarettes, chewing tobacco, and electronic cigarettes.  If you have any questions.  Other tests or screenings that may be performed during your third trimester include:  Blood tests that check for low iron levels (anemia).  Fetal testing to check the health, activity level, and growth of the fetus. Testing is done if you have certain medical conditions or if there are problems during the  pregnancy.  Nonstress test (NST). This test checks the health of your baby to make sure there are no signs of problems, such as the baby not getting enough oxygen. During this test, a belt is placed around your belly. The baby is made to move, and its heart rate is monitored during movement.  What is false labor? False labor is a condition in which you feel small, irregular tightenings of the muscles in the womb (contractions) that usually go away with rest, changing position, or drinking water. These are called Braxton Hicks contractions. Contractions may last for hours, days, or even weeks before true labor sets in. If contractions come at regular intervals, become more frequent, increase in intensity, or become painful, you should see your health care provider. What are the signs of labor?  Abdominal cramps.  Regular contractions that start at 10 minutes apart and become stronger and more frequent with time.  Contractions that start on the top of the uterus and spread down to the lower abdomen and back.  Increased pelvic pressure and dull back pain.  A watery or bloody mucus discharge that comes from the vagina.  Leaking of amniotic fluid. This is also known as your "water breaking." It could be a slow trickle or a gush. Let your health care provider know if it has a color or strange odor. If you have any of these signs, call your health care provider right away, even if it is before your due date. Follow these instructions at home: Medicines  Follow your health care provider's instructions regarding medicine use. Specific medicines may be either safe or unsafe to take during pregnancy.  Take a prenatal vitamin that contains at least 600 micrograms (mcg) of folic acid.  If you develop constipation, try taking a stool softener if your health care provider approves. Eating and drinking  Eat a balanced diet that includes fresh fruits and vegetables, whole grains, good sources of protein  such as meat, eggs, or tofu, and low-fat dairy. Your health care provider will help you determine the amount of weight gain that is right for you.  Avoid raw meat and uncooked cheese. These carry germs that can cause birth defects in the baby.  If you have low calcium intake from food, talk to your health care provider about whether you should take a daily calcium supplement.  Eat four or five small meals rather than three large meals a day.  Limit foods that are high in fat and processed sugars, such as fried and sweet foods.  To prevent constipation: ? Drink enough fluid to keep your urine clear or pale yellow. ? Eat foods that are high in fiber, such as fresh fruits and vegetables, whole grains, and beans. Activity  Exercise only as directed by your health care provider. Most women can continue their usual exercise routine during pregnancy. Try to exercise for 30 minutes at least 5 days a week. Stop exercising if you experience uterine contractions.  Avoid heavy   lifting.  Do not exercise in extreme heat or humidity, or at high altitudes.  Wear low-heel, comfortable shoes.  Practice good posture.  You may continue to have sex unless your health care provider tells you otherwise. Relieving pain and discomfort  Take frequent breaks and rest with your legs elevated if you have leg cramps or low back pain.  Take warm sitz baths to soothe any pain or discomfort caused by hemorrhoids. Use hemorrhoid cream if your health care provider approves.  Wear a good support bra to prevent discomfort from breast tenderness.  If you develop varicose veins: ? Wear support pantyhose or compression stockings as told by your healthcare provider. ? Elevate your feet for 15 minutes, 3-4 times a day. Prenatal care  Write down your questions. Take them to your prenatal visits.  Keep all your prenatal visits as told by your health care provider. This is important. Safety  Wear your seat belt at  all times when driving.  Make a list of emergency phone numbers, including numbers for family, friends, the hospital, and police and fire departments. General instructions  Avoid cat litter boxes and soil used by cats. These carry germs that can cause birth defects in the baby. If you have a cat, ask someone to clean the litter box for you.  Do not travel far distances unless it is absolutely necessary and only with the approval of your health care provider.  Do not use hot tubs, steam rooms, or saunas.  Do not drink alcohol.  Do not use any products that contain nicotine or tobacco, such as cigarettes and e-cigarettes. If you need help quitting, ask your health care provider.  Do not use any medicinal herbs or unprescribed drugs. These chemicals affect the formation and growth of the baby.  Do not douche or use tampons or scented sanitary pads.  Do not cross your legs for long periods of time.  To prepare for the arrival of your baby: ? Take prenatal classes to understand, practice, and ask questions about labor and delivery. ? Make a trial run to the hospital. ? Visit the hospital and tour the maternity area. ? Arrange for maternity or paternity leave through employers. ? Arrange for family and friends to take care of pets while you are in the hospital. ? Purchase a rear-facing car seat and make sure you know how to install it in your car. ? Pack your hospital bag. ? Prepare the baby's nursery. Make sure to remove all pillows and stuffed animals from the baby's crib to prevent suffocation.  Visit your dentist if you have not gone during your pregnancy. Use a soft toothbrush to brush your teeth and be gentle when you floss. Contact a health care provider if:  You are unsure if you are in labor or if your water has broken.  You become dizzy.  You have mild pelvic cramps, pelvic pressure, or nagging pain in your abdominal area.  You have lower back pain.  You have persistent  nausea, vomiting, or diarrhea.  You have an unusual or bad smelling vaginal discharge.  You have pain when you urinate. Get help right away if:  Your water breaks before 37 weeks.  You have regular contractions less than 5 minutes apart before 37 weeks.  You have a fever.  You are leaking fluid from your vagina.  You have spotting or bleeding from your vagina.  You have severe abdominal pain or cramping.  You have rapid weight loss or weight gain.    You have shortness of breath with chest pain.  You notice sudden or extreme swelling of your face, hands, ankles, feet, or legs.  Your baby makes fewer than 10 movements in 2 hours.  You have severe headaches that do not go away when you take medicine.  You have vision changes. Summary  The third trimester is from week 28 through week 40, months 7 through 9. The third trimester is a time when the unborn baby (fetus) is growing rapidly.  During the third trimester, your discomfort may increase as you and your baby continue to gain weight. You may have abdominal, leg, and back pain, sleeping problems, and an increased need to urinate.  During the third trimester your breasts will keep growing and they will continue to become tender. A yellow fluid (colostrum) may leak from your breasts. This is the first milk you are producing for your baby.  False labor is a condition in which you feel small, irregular tightenings of the muscles in the womb (contractions) that eventually go away. These are called Braxton Hicks contractions. Contractions may last for hours, days, or even weeks before true labor sets in.  Signs of labor can include: abdominal cramps; regular contractions that start at 10 minutes apart and become stronger and more frequent with time; watery or bloody mucus discharge that comes from the vagina; increased pelvic pressure and dull back pain; and leaking of amniotic fluid. This information is not intended to replace advice  given to you by your health care provider. Make sure you discuss any questions you have with your health care provider. Document Released: 06/11/2001 Document Revised: 11/23/2015 Document Reviewed: 08/18/2012 Elsevier Interactive Patient Education  2017 [employer]  

## 2017-10-13 NOTE — Progress Notes (Signed)
No concerns.rj 

## 2017-10-23 ENCOUNTER — Observation Stay
Admission: RE | Admit: 2017-10-23 | Discharge: 2017-10-23 | Disposition: A | Discharge status: Home / Self Care | Payer: Medicaid Other | Attending: Obstetrics and Gynecology | Admitting: Obstetrics and Gynecology

## 2017-10-23 DIAGNOSIS — O4703 False labor before 37 completed weeks of gestation, third trimester: Principal | ICD-10-CM | POA: Insufficient documentation

## 2017-10-23 DIAGNOSIS — Z3A31 31 weeks gestation of pregnancy: Secondary | ICD-10-CM | POA: Diagnosis not present

## 2017-10-23 DIAGNOSIS — O0990 Supervision of high risk pregnancy, unspecified, unspecified trimester: Secondary | ICD-10-CM

## 2017-10-23 DIAGNOSIS — N9489 Other specified conditions associated with female genital organs and menstrual cycle: Secondary | ICD-10-CM | POA: Diagnosis present

## 2017-10-23 DIAGNOSIS — Z6841 Body Mass Index (BMI) 40.0 and over, adult: Secondary | ICD-10-CM

## 2017-10-23 HISTORY — DX: Headache, unspecified: R51.9

## 2017-10-23 HISTORY — DX: Headache: R51

## 2017-10-23 MED ORDER — BETAMETHASONE SOD PHOS & ACET 6 (3-3) MG/ML IJ SUSP
INTRAMUSCULAR | Status: AC
  Administered 2017-10-23: 6 mg
  Filled 2017-10-23: qty 1

## 2017-10-23 MED ORDER — DOCUSATE SODIUM 100 MG PO CAPS: 100.0000 mg | ORAL_CAPSULE | Freq: Every day | ORAL | 3 refills | Status: DC | PRN

## 2017-10-23 MED ORDER — BETAMETHASONE SOD PHOS & ACET 6 (3-3) MG/ML IJ SUSP: 12.0000 mg | Freq: Once | INTRAMUSCULAR | Status: DC

## 2017-10-23 NOTE — Final Progress Note (Signed)
Triage Visit Note  CHIEF COMPLAINT:   Chief Complaint  Patient presents with  . Abdominal Cramping    Subjective  Patient presents to labor and delivery complaining of abdominal pain.  The abdominal pain started this afternoon and has been mild.  Patient is resting comfortably in bed in triage.  Patient denies any leakage of fluid.  Patient denies any strong current contractions.  Patient denies any vaginal bleeding.  Patient reports good fetal movement.  Patient reports that she has had a small amount of white vaginal discharge for her entire pregnancy.  Amy Ware also reports that she has had almost no water today.  She denies any nausea or vomiting.  She reports problems with constipation. She reports that she has a fast labor with her last pregnancy.     Objective   Examination:  General exam: Appears calm and comfortable  Respiratory system: Clear to auscultation. Respiratory effort normal. HEENT: [place]/AT, PERRLA, no thrush, no stridor. Cardiovascular system: S1 & S2 heard, RRR. No JVD, murmurs, rubs, gallops or clicks. No pedal edema. Gastrointestinal system: Abdomen is nondistended, soft and nontender. No organomegaly or masses felt. Normal bowel sounds heard. Central nervous system: Alert and oriented. No focal neurological deficits. Extremities: Symmetric 5 x 5 power. Skin: No rashes, lesions or ulcers Psychiatry: Judgement and insight appear normal. Mood & affect appropriate.  Pelvic:  Normal white vaginal discharge. SVE: 1/70/-3 [place]: Negative for pathogens.  Whiff negative. No trichomoniasis, yeast, or clue cells seen.   VITALS:  height is 5' (1.524 m) and weight is 229 lb (103.9 kg). Her oral temperature is 98.3 F (36.8 C). Her blood pressure is 134/68 and her pulse is 70. Her respiration is 18.   I personally reviewed Labs under Results section.  NST: 120s, moderate variability, no decelerations, 10x10 accelerations present. Reactive category I. Tocometer quiet.    Assessment/Plan:  21yo at 31 weeks 6 days gestation with abdominal cramping. No uterine contractions seen or palpated. FFN was not collected, but patient is dilated on exam. Will give betamethasone today and Amy Ware to follow up tomorrow for a second dose. Encouraged oral hydration.   Time spent: 30 minutes   LOS: 0 days   Sevag Shearn R Emelee Rodocker

## 2017-10-23 NOTE — OB Triage Note (Addendum)
Pt presents from home c/o abdominal cramps that started 10/22/17 after noon. Pt states cramps range in intensity but the worst cramps are 6/10 pain scale. Pt states she took tylenol at home with no relief and walking makes the cramps worse. Last intercourse was Sunday 10/19/17. Pt denies VB and states no leaking fluid but has had thick white discharge this entire pregnancy. Pt states positive FM.

## 2017-10-23 NOTE — Discharge Summary (Signed)
  Please see final progress note. 

## 2017-10-24 ENCOUNTER — Inpatient Hospital Stay
Admission: RE | Admit: 2017-10-24 | Discharge: 2017-10-24 | Disposition: A | Discharge status: Home / Self Care | Payer: Medicaid Other | Attending: Obstetrics and Gynecology | Admitting: Obstetrics and Gynecology

## 2017-10-24 DIAGNOSIS — O0993 Supervision of high risk pregnancy, unspecified, third trimester: Secondary | ICD-10-CM | POA: Diagnosis present

## 2017-10-24 DIAGNOSIS — Z3A32 32 weeks gestation of pregnancy: Secondary | ICD-10-CM | POA: Diagnosis not present

## 2017-10-24 MED ORDER — BETAMETHASONE SOD PHOS & ACET 6 (3-3) MG/ML IJ SUSP
12.0000 mg | Freq: Once | INTRAMUSCULAR | Status: AC
  Administered 2017-10-24: 12 mg via INTRAMUSCULAR

## 2017-10-27 ENCOUNTER — Other Ambulatory Visit: Payer: Medicaid Other

## 2017-10-27 ENCOUNTER — Encounter: Payer: Medicaid Other | Admitting: Maternal Newborn

## 2017-10-31 ENCOUNTER — Encounter: Payer: Self-pay | Admitting: Maternal Newborn

## 2017-10-31 ENCOUNTER — Ambulatory Visit (INDEPENDENT_AMBULATORY_CARE_PROVIDER_SITE_OTHER): Payer: Medicaid Other

## 2017-10-31 ENCOUNTER — Ambulatory Visit (INDEPENDENT_AMBULATORY_CARE_PROVIDER_SITE_OTHER): Payer: Medicaid Other | Admitting: Maternal Newborn

## 2017-10-31 VITALS — BP 120/60 | Wt 228.0 lb

## 2017-10-31 DIAGNOSIS — O0990 Supervision of high risk pregnancy, unspecified, unspecified trimester: Secondary | ICD-10-CM

## 2017-10-31 DIAGNOSIS — Z3689 Encounter for other specified antenatal screening: Secondary | ICD-10-CM | POA: Diagnosis not present

## 2017-10-31 DIAGNOSIS — Z3A33 33 weeks gestation of pregnancy: Secondary | ICD-10-CM

## 2017-10-31 DIAGNOSIS — Z6841 Body Mass Index (BMI) 40.0 and over, adult: Secondary | ICD-10-CM | POA: Diagnosis not present

## 2017-10-31 NOTE — Progress Notes (Signed)
C/o would like cx to be ck'd as she was dilated 1cm then.rj

## 2017-10-31 NOTE — Progress Notes (Signed)
    Routine Prenatal Care Visit  Subjective  Amy Ware is a 22 y.o. G2P1001 at [redacted]w[redacted]d being seen today for ongoing prenatal care.  She is currently monitored for the following issues for this high-risk pregnancy and has Sepsis (HCC); Pyelonephritis; Supervision of high risk pregnancy, antepartum; Obesity affecting pregnancy, antepartum; BMI 40.0-44.9, adult (HCC); and Uterine cramping on their problem list.  ----------------------------------------------------------------------------------- Patient reports occasional contractions.  They are infrequent and resolve with time. Contractions: Irregular. Vag. Bleeding: None.  Movement: Present. Denies leaking of fluid.  ----------------------------------------------------------------------------------- The following portions of the patient's history were reviewed and updated as appropriate: allergies, current medications, past family history, past medical history, past social history, past surgical history and problem list. Problem list updated.   Objective  Blood pressure 120/60, weight 228 lb (103.4 kg), last menstrual period 03/14/2017. Pregravid weight 212 lb (96.2 kg) Total Weight Gain 16 lb (7.258 kg) Urinalysis: Urine Protein: Negative Urine Glucose: Negative  Fetal Status: Fetal Heart Rate (bpm): 136 Fundal Height: 34 cm Movement: Present     General:  Alert, oriented and cooperative. Patient is in no acute distress.  Skin: Skin is warm and dry. No rash noted.   Cardiovascular: Normal heart rate noted  Respiratory: Normal respiratory effort, no problems with respiration noted  Abdomen: Soft, gravid, appropriate for gestational age. Pain/Pressure: Absent     Pelvic:  Cervical exam performed Dilation: Fingertip Effacement (%): 50 Station: -3  Extremities: Normal range of motion.     Mental Status: Normal mood and affect. Normal behavior. Normal judgment and thought content.     Assessment   22 y.o. G2P1001 at [redacted]w[redacted]d, EDD 12/19/2017  by Last Menstrual Period presenting for routine prenatal visit.  Plan   Pregnancy #2 Problems (from 03/14/17 to present)    Problem Noted Resolved   Supervision of high risk pregnancy, antepartum 04/28/2017 by Tresea Mall, CNM No   Overview Addendum 09/30/2017  1:17 PM by Oswaldo Conroy, CNM    Clinic [facility] Prenatal Labs  Dating  Blood type: O/Positive/-- (11/12 1011)   Genetic Screen 1 Screen: neg    AFP:      Antibody:Negative (11/12 1011)  Anatomic [place] Low lying placenta: resolved at 28 weeks Rubella: 3.29 (11/12 1011) Varicella: Immune  GTT Third trimester: 136 RPR: Non Reactive (11/12 1011)   Rhogam  HBsAg: Negative (11/12 1011)   TDaP vaccine                       Flu Shot: HIV:     Baby Food                                GBS:   Contraception  Pap:  CBB     CS/VBAC NA   Support Person Partner: Javon          BMI 40.0-44.9, adult (HCC) 04/28/2017 by Tresea Mall, CNM No   Low-lying placenta 08/04/2017 by [name], MD 09/29/2017 by Oswaldo Conroy, CNM      Preterm labor symptoms and general obstetric precautions including but not limited to vaginal bleeding, contractions, leaking of fluid and fetal movement were reviewed.  Please refer to After Visit Summary for other counseling recommendations.   Return in about 2 weeks (around 11/14/2017) for ROB.  Marcelyn Bruins, CNM 10/31/2017  1:37 PM

## 2017-11-11 ENCOUNTER — Observation Stay
Admission: RE | Admit: 2017-11-11 | Discharge: 2017-11-12 | Disposition: A | Discharge status: Home / Self Care | Payer: Medicaid Other | Attending: Obstetrics and Gynecology | Admitting: Obstetrics and Gynecology

## 2017-11-11 DIAGNOSIS — Z87891 Personal history of nicotine dependence: Secondary | ICD-10-CM | POA: Insufficient documentation

## 2017-11-11 DIAGNOSIS — Z6841 Body Mass Index (BMI) 40.0 and over, adult: Secondary | ICD-10-CM

## 2017-11-11 DIAGNOSIS — R1030 Lower abdominal pain, unspecified: Secondary | ICD-10-CM | POA: Insufficient documentation

## 2017-11-11 DIAGNOSIS — O26893 Other specified pregnancy related conditions, third trimester: Secondary | ICD-10-CM | POA: Diagnosis not present

## 2017-11-11 DIAGNOSIS — O4703 False labor before 37 completed weeks of gestation, third trimester: Secondary | ICD-10-CM | POA: Diagnosis not present

## 2017-11-11 DIAGNOSIS — O0990 Supervision of high risk pregnancy, unspecified, unspecified trimester: Secondary | ICD-10-CM

## 2017-11-11 DIAGNOSIS — Z3A34 34 weeks gestation of pregnancy: Secondary | ICD-10-CM | POA: Diagnosis not present

## 2017-11-11 DIAGNOSIS — R109 Unspecified abdominal pain: Secondary | ICD-10-CM

## 2017-11-11 LAB — CHLAMYDIA/NGC RT PCR (ARMC ONLY)
CHLAMYDIA TR: NOT DETECTED
N GONORRHOEAE: NOT DETECTED

## 2017-11-11 LAB — URINALYSIS, COMPLETE (UACMP) WITH MICROSCOPIC
Bacteria, UA: NONE SEEN
Bilirubin Urine: NEGATIVE
GLUCOSE, UA: NEGATIVE mg/dL
HGB URINE DIPSTICK: NEGATIVE
Ketones, ur: NEGATIVE mg/dL
Leukocytes, UA: NEGATIVE
Nitrite: NEGATIVE
PH: 7 (ref 5.0–8.0)
PROTEIN: 30 mg/dL — AB
Specific Gravity, Urine: 1.024 (ref 1.005–1.030)

## 2017-11-11 LAB — WET PREP, GENITAL
CLUE CELLS WET PREP: NONE SEEN
SPERM: NONE SEEN
Trich, Wet Prep: NONE SEEN
Yeast Wet Prep HPF POC: NONE SEEN

## 2017-11-11 NOTE — OB Triage Note (Signed)
Amy Ware here with c/o N/V/D at 0845 this morning, vomiting X1, diarrhea X3, denies bleeding, LOF, reports positive fetal movement. Pt also reports intermittant lower abdominal pain, unable to speak to frequency.

## 2017-11-11 NOTE — Progress Notes (Signed)
Pt given crackers and soda for po intake trial

## 2017-11-12 ENCOUNTER — Telehealth: Payer: Self-pay

## 2017-11-12 DIAGNOSIS — O26893 Other specified pregnancy related conditions, third trimester: Secondary | ICD-10-CM | POA: Diagnosis not present

## 2017-11-12 LAB — CULTURE, BETA STREP (GROUP B ONLY)

## 2017-11-12 NOTE — Telephone Encounter (Signed)
Lab calling to make sure we got the results for + GBS swab on pt.

## 2017-11-12 NOTE — Discharge Instructions (Signed)
Please call or return to the Birthplace at [facility] with any of the following: Vaginal bleeding Decreased fetal movement Contractions every 3-5 mins Leaking of fluid

## 2017-11-12 NOTE — Final Progress Note (Signed)
Physician Final Progress Note  Patient ID: Amy Ware MRN: [id number] DOB/AGE: 1996-04-11 22 y.o.  Admit date: 11/11/2017 Admitting provider: Conard Novak, MD Discharge date: 11/12/2017   Admission Diagnoses:  1) intrauterine pregnancy at [redacted]w[redacted]d 2) lower abdominal pain in pregnancy, third trimester  Discharge Diagnoses:  1) intrauterine pregnancy at [redacted]w[redacted]d 2) lower abdominal pain in pregnancy, third trimester 3) Preterm labor  History of Present Illness: The patient is a 22 y.o. female G2P1001 at [redacted]w[redacted]d who presents for acute-onset mild lower abdominal pain. She woke up this morning and had nausea with emesis. She also had an episode of diarrhea. She went back to sleep and when she woke up about noon today she was having mild lower abdominal cramping. However, it concerned her. So, she presented to Labor and Delivery.  Upon presentation, she noted +FM, no LOF, no vaginal bleeding, and mild lower abdominal cramping. Her pregnancy has been complicated by preterm labor and dilation to a fingertip and she received BMTZ on 4/25 and 4/26.    Hospital Course: She was admitted for observation. She had a preterm labor workup, which was negative. Her cervical exam was 3/60/-2, which was a change from her last clinic visit.  Her cervical exam was stable during her hospital stay, over three checks and 9 hours. Her fetal tracing was reactive. She did have some uterine activity on tocometry. However, she noted this only mildly (though at times she rated the pain as high as 4/10 and this was essentially unchanged during her hospital stay). Given the stability of her cervical exam, she was discharged with strict precautions to return to clinic or L&D should she develop worsening cramping.     Past Medical History:  Diagnosis Date  . Headache   . Low-lying placenta 08/04/2017  . Patient denies medical problems     Past Surgical History:  Procedure Laterality Date  . NO PAST SURGERIES      No  current facility-administered medications on file prior to encounter.    Current Outpatient Medications on File Prior to Encounter  Medication Sig Dispense Refill  . Prenatal Vit-Fe Fumarate-FA (PRENATAL MULTIVITAMIN) TABS tablet Take 1 tablet by mouth daily at 12 noon.      No Known Allergies  Social History   Socioeconomic History  . Marital status: Single    Spouse name: Not on file  . Number of children: Not on file  . Years of education: Not on file  . Highest education level: Not on file  Occupational History  . Not on file  Social Needs  . Financial resource strain: Not on file  . Food insecurity:    Worry: Not on file    Inability: Not on file  . Transportation needs:    Medical: Not on file    Non-medical: Not on file  Tobacco Use  . Smoking status: Former Games developer  . Smokeless tobacco: Never Used  Substance and Sexual Activity  . Alcohol use: No  . Drug use: No  . Sexual activity: Yes    Birth control/protection: Implant    Comment: Nexplanon  Lifestyle  . Physical activity:    Days per week: Not on file    Minutes per session: Not on file  . Stress: Not on file  Relationships  . Social connections:    Talks on phone: Not on file    Gets together: Not on file    Attends religious service: Not on file    Active member of club or  organization: Not on file    Attends meetings of clubs or organizations: Not on file    Relationship status: Not on file  . Intimate partner violence:    Fear of current or ex partner: Not on file    Emotionally abused: Not on file    Physically abused: Not on file    Forced sexual activity: Not on file  Other Topics Concern  . Not on file  Social History Narrative  . Not on file   Family History  Problem Relation Age of Onset  . Diabetes Maternal Uncle   . Breast cancer Other      Physical Exam: BP 106/60 (BP Location: Left Arm)   Pulse 72   Temp 98.2 F (36.8 C) (Oral)   Resp 16   Ht 5' (1.524 m)   Wt 228 lb  (103.4 kg)   LMP 03/14/2017 (Exact Date)   BMI 44.53 kg/m   Physical Exam  Constitutional: She is oriented to person, place, and time. She appears well-developed and well-nourished. No distress.  HENT:  Head: Normocephalic and atraumatic.  Eyes: Conjunctivae are normal. No scleral icterus.  Cardiovascular: Normal rate and regular rhythm.  Pulmonary/Chest: Effort normal and breath sounds normal. No respiratory distress.  Abdominal: Soft. Bowel sounds are normal.  Gravid, NT  Genitourinary: Vagina normal. Pelvic exam was performed with patient supine. There is no rash, tenderness or lesion on the right labia. There is no rash, tenderness or lesion on the left labia. No erythema, tenderness or bleeding in the vagina. No signs of injury around the vagina.  Genitourinary Comments: Cervix: 3/60/-2 - stable over 3 check over a 9 hour period.  Musculoskeletal: Normal range of motion. She exhibits no edema.  Neurological: She is alert and oriented to person, place, and time. No cranial nerve deficit.  Psychiatric: She has a normal mood and affect. Her behavior is normal. Judgment normal.   Female chaperone present for pelvic exam:    Consults: None  Significant Findings/ Diagnostic Studies:  Lab Results  Component Value Date   APPEARANCEUR CLEAR (A) 11/11/2017   GLUCOSEU NEGATIVE 11/11/2017   BILIRUBINUR NEGATIVE 11/11/2017   KETONESUR NEGATIVE 11/11/2017   LABSPEC 1.024 11/11/2017   HGBUR NEGATIVE 11/11/2017   PHURINE 7.0 11/11/2017   NITRITE NEGATIVE 11/11/2017   LEUKOCYTESUR NEGATIVE 11/11/2017   RBCU 0-5 11/11/2017   WBCU 0-5 11/11/2017   BACTERIA NONE SEEN 11/11/2017   EPIU 6-10 11/11/2017    Lab Results  Component Value Date   CHLAMYDIA NOT DETECTED 11/11/2017   NGONORRHOEAE NOT DETECTED 11/11/2017   Lab Results  Component Value Date   TRICHWETPREP NONE SEEN 11/11/2017   CLUECELLS NONE SEEN 11/11/2017   WBCWETPREP FEW (A) 11/11/2017   YEASTWETPREP NONE SEEN 11/11/2017     GBS pending  Procedures: NST Baseline FHR: 125 beats/min Variability: moderate Accelerations: present Decelerations: absent Tocometry: occasional  Interpretation:  INDICATIONS: rule out uterine contractions RESULTS:  A NST procedure was performed with FHR monitoring and a normal baseline established, appropriate time of 20-40 minutes of evaluation, and accels >2 seen w 15x15 characteristics.  Results show a REACTIVE NST.    Discharge Condition: stable  Disposition: Discharge disposition: 01-Home or Self Care       Diet: Regular diet  Discharge Activity: Activity as tolerated   Allergies as of 11/12/2017   No Known Allergies     Medication List    TAKE these medications   prenatal multivitamin Tabs tablet Take 1 tablet by mouth daily  at 12 noon.      Follow-up Information    [facility] Follow up on 11/14/2017.   Specialty:  Obstetrics and Gynecology Why:  Keep previously scheduled appointment Contact information: [address] [place] [address] [phone]          Total time spent taking care of this patient: 45 minutes  Signed: Thomasene Mohair, MD  11/12/2017, 12:00 AM

## 2017-11-12 NOTE — Discharge Summary (Signed)
See Final Progress Note 

## 2017-11-14 ENCOUNTER — Encounter: Payer: Medicaid Other | Admitting: Maternal Newborn

## 2017-11-17 ENCOUNTER — Ambulatory Visit (INDEPENDENT_AMBULATORY_CARE_PROVIDER_SITE_OTHER): Payer: Medicaid Other | Admitting: Advanced Practice Midwife

## 2017-11-17 ENCOUNTER — Encounter: Payer: Self-pay | Admitting: Advanced Practice Midwife

## 2017-11-17 VITALS — BP 124/74 | Wt 228.0 lb

## 2017-11-17 DIAGNOSIS — Z3A35 35 weeks gestation of pregnancy: Secondary | ICD-10-CM

## 2017-11-17 DIAGNOSIS — O0990 Supervision of high risk pregnancy, unspecified, unspecified trimester: Secondary | ICD-10-CM

## 2017-11-17 NOTE — Progress Notes (Signed)
Routine Prenatal Care Visit  Subjective  Amy Ware is a 22 y.o. G2P1001 at [redacted]w[redacted]d being seen today for ongoing prenatal care.  She is currently monitored for the following issues for this high-risk pregnancy and has Sepsis (HCC); Pyelonephritis; Supervision of high risk pregnancy, antepartum; Obesity affecting pregnancy, antepartum; BMI 40.0-44.9, adult (HCC); Uterine cramping; Abdominal pain during pregnancy, third trimester; and Preterm labor in third trimester without delivery on their problem list.  ----------------------------------------------------------------------------------- Patient reports fatigue.   Contractions: Irregular. Vag. Bleeding: None.  Movement: Present. Denies leaking of fluid.  ----------------------------------------------------------------------------------- The following portions of the patient's history were reviewed and updated as appropriate: allergies, current medications, past family history, past medical history, past social history, past surgical history and problem list. Problem list updated.   Objective  Blood pressure 124/74, weight 228 lb (103.4 kg), last menstrual period 03/14/2017. Pregravid weight 212 lb (96.2 kg) Total Weight Gain 16 lb (7.258 kg) Urinalysis:      Fetal Status: Fetal Heart Rate (bpm): 128 Fundal Height: 37 cm Movement: Present  Presentation: Vertex  General:  Alert, oriented and cooperative. Patient is in no acute distress.  Skin: Skin is warm and dry. No rash noted.   Cardiovascular: Normal heart rate noted  Respiratory: Normal respiratory effort, no problems with respiration noted  Abdomen: Soft, gravid, appropriate for gestational age. Pain/Pressure: Absent     Pelvic:  Cervical exam performed Dilation: 3 Effacement (%): 60 Station: -2  Extremities: Normal range of motion.  Edema: None  Mental Status: Normal mood and affect. Normal behavior. Normal judgment and thought content.   Assessment   22 y.o. G2P1001 at [redacted]w[redacted]d by   12/19/2017, by Last Menstrual Period presenting for routine prenatal visit  Plan   Pregnancy #2 Problems (from 03/14/17 to present)    Problem Noted Resolved   Preterm labor in third trimester without delivery 11/12/2017 by Conard Novak, MD No   Supervision of high risk pregnancy, antepartum 04/28/2017 by Tresea Mall, CNM No   Overview Addendum 09/30/2017  1:17 PM by Oswaldo Conroy, CNM    Clinic [facility] Prenatal Labs  Dating  Blood type: O/Positive/-- (11/12 1011)   Genetic Screen 1 Screen: neg    AFP:      Antibody:Negative (11/12 1011)  Anatomic [place] Low lying placenta: resolved at 28 weeks Rubella: 3.29 (11/12 1011) Varicella: Immune  GTT Third trimester: 136 RPR: Non Reactive (11/12 1011)   Rhogam  HBsAg: Negative (11/12 1011)   TDaP vaccine                       Flu Shot: HIV:     Baby Food                                GBS:   Contraception  Pap:  CBB     CS/VBAC NA   Support Person Partner: Javon          BMI 40.0-44.9, adult (HCC) 04/28/2017 by Tresea Mall, CNM No   Low-lying placenta 08/04/2017 by [name], MD 09/29/2017 by Oswaldo Conroy, CNM       Preterm labor symptoms and general obstetric precautions including but not limited to vaginal bleeding, contractions, leaking of fluid and fetal movement were reviewed in detail with the patient. Please refer to After Visit Summary for other counseling recommendations.   Return in about 1 week (around 11/24/2017) for growth/afi/nst/rob.  Tresea Mall,  CNM 11/17/2017 3:29 PM

## 2017-11-17 NOTE — Progress Notes (Signed)
Pt would like cervical check, was seen at hospital for contractions.

## 2017-11-17 NOTE — Patient Instructions (Signed)
Nonstress Test The nonstress test is a procedure that monitors the fetus's heartbeat. The test will monitor the heartbeat when the fetus is at rest and while the fetus is moving. In a healthy fetus, there will be an increase in fetal heart rate when the fetus moves or kicks. The heart rate will decrease at rest. This test helps determine if the fetus is healthy. Your health care provider will look at a number of patterns in the heart rate tracing to make sure your baby is thriving. If there is concern, your health care provider may order additional tests or may suggest another course of action. This test is often done in the third trimester and can help determine if an early delivery is needed and safe. Common reasons to have this test are:  You are past your due date.  You have a high-risk pregnancy.  You are feeling less movement than normal.  You have lost a pregnancy in the past.  Your health care provider suspects fetal growth problems.  You have too much or too little amniotic fluid.  What happens before the procedure?  Eat a meal right before the test or as directed by your health care provider. Food may help stimulate fetal movements.  Use the restroom right before the test. What happens during the procedure?  Two belts will be placed around your abdomen. These belts have monitors attached to them. One records the fetal heart rate and the other records uterine contractions.  You may be asked to lie down on your side or to stay sitting upright.  You may be given a button to press when you feel movement.  The fetal heartbeat is listened to and watched on a screen. The heartbeat is recorded on a sheet of paper.  If the fetus seems to be sleeping, you may be asked to drink some juice or soda, gently press your abdomen, or make some noise to wake the fetus. What happens after the procedure? Your health care provider will discuss the test results with you and make recommendations  for the near future.  This information is not intended to replace advice given to you by your health care provider. Make sure you discuss any questions you have with your health care provider. This information is not intended to replace advice given to you by your health care provider. Make sure you discuss any questions you have with your health care provider. Document Released: 06/07/2002 Document Revised: 05/17/2016 Document Reviewed: 07/21/2012 Elsevier Interactive Patient Education  2018 Elsevier Inc.  

## 2017-11-25 ENCOUNTER — Ambulatory Visit (INDEPENDENT_AMBULATORY_CARE_PROVIDER_SITE_OTHER): Payer: Medicaid Other

## 2017-11-25 ENCOUNTER — Ambulatory Visit (INDEPENDENT_AMBULATORY_CARE_PROVIDER_SITE_OTHER): Payer: Medicaid Other | Admitting: Obstetrics and Gynecology

## 2017-11-25 VITALS — BP 130/78 | Wt 233.0 lb

## 2017-11-25 DIAGNOSIS — O0993 Supervision of high risk pregnancy, unspecified, third trimester: Secondary | ICD-10-CM

## 2017-11-25 DIAGNOSIS — Z3A36 36 weeks gestation of pregnancy: Secondary | ICD-10-CM

## 2017-11-25 DIAGNOSIS — O99210 Obesity complicating pregnancy, unspecified trimester: Secondary | ICD-10-CM

## 2017-11-25 DIAGNOSIS — Z3685 Encounter for antenatal screening for Streptococcus B: Secondary | ICD-10-CM

## 2017-11-25 DIAGNOSIS — O99213 Obesity complicating pregnancy, third trimester: Secondary | ICD-10-CM | POA: Diagnosis not present

## 2017-11-25 DIAGNOSIS — O98813 Other maternal infectious and parasitic diseases complicating pregnancy, third trimester: Secondary | ICD-10-CM | POA: Diagnosis not present

## 2017-11-25 DIAGNOSIS — A419 Sepsis, unspecified organism: Secondary | ICD-10-CM | POA: Diagnosis not present

## 2017-11-25 DIAGNOSIS — O0990 Supervision of high risk pregnancy, unspecified, unspecified trimester: Secondary | ICD-10-CM

## 2017-11-25 DIAGNOSIS — N16 Renal tubulo-interstitial disorders in diseases classified elsewhere: Secondary | ICD-10-CM

## 2017-11-25 DIAGNOSIS — Z113 Encounter for screening for infections with a predominantly sexual mode of transmission: Secondary | ICD-10-CM

## 2017-11-25 LAB — FETAL NONSTRESS TEST

## 2017-11-25 NOTE — Progress Notes (Signed)
ROB Growth scan/NST GBS/Aptima

## 2017-11-25 NOTE — Progress Notes (Signed)
Routine Prenatal Care Visit  Subjective  Amy Ware is a 22 y.o. G2P1001 at [redacted]w[redacted]d being seen today for ongoing prenatal care.  She is currently monitored for the following issues for this high-risk pregnancy and has Sepsis (HCC); Pyelonephritis; Supervision of high risk pregnancy, antepartum; Obesity affecting pregnancy, antepartum; BMI 40.0-44.9, adult (HCC); Uterine cramping; Abdominal pain during pregnancy, third trimester; and Preterm labor in third trimester without delivery on their problem list.  ----------------------------------------------------------------------------------- Patient reports no complaints.   Contractions: Irregular. Vag. Bleeding: None.  Movement: Present. Denies leaking of fluid.  ----------------------------------------------------------------------------------- The following portions of the patient's history were reviewed and updated as appropriate: allergies, current medications, past family history, past medical history, past social history, past surgical history and problem list. Problem list updated.   Objective  Blood pressure 130/78, weight 233 lb (105.7 kg), last menstrual period 03/14/2017. Pregravid weight 212 lb (96.2 kg) Total Weight Gain 21 lb (9.526 kg) Urinalysis: Urine Protein: Negative Urine Glucose: Negative  Fetal Status: Fetal Heart Rate (bpm): 135   Movement: Present  Presentation: Vertex  General:  Alert, oriented and cooperative. Patient is in no acute distress.  Skin: Skin is warm and dry. No rash noted.   Cardiovascular: Normal heart rate noted  Respiratory: Normal respiratory effort, no problems with respiration noted  Abdomen: Soft, gravid, appropriate for gestational age. Pain/Pressure: Absent     Pelvic:  Cervical exam performed Dilation: 3 Effacement (%): 70 Station: -3  Extremities: Normal range of motion.     ental Status: Normal mood and affect. Normal behavior. Normal judgment and thought content.   Baseline:  135  Variability: moderate Accelerations: present Decelerations: absent Tocometry: N/A The patient was monitored for 30 minutes, fetal heart rate tracing was deemed reactive, category I tracing,  CPT 59025   US Ob Follow Up  Result Date: 11/25/2017 ULTRASOUND REPORT Patient Name: Amy Ware DOB: 1995/09/22 MRN: [id number] Location: [facility] Date of Service: 11/25/2017 Indications:growth/afi Findings: Mason Jim intrauterine pregnancy is visualized with FHR at 133 BPM. Biometrics give an (U/S) Gestational age of [redacted]w[redacted]d and an (U/S) EDD of 01/03/2018; this correlates with the clinically established Estimated Date of Delivery: 12/19/17. Fetal presentation is Cephalic. Placenta: Anterior, grade 2. AFI: 8.47 cm Growth percentile is 16th. EFW: 5lbs 8oz Impression: 1. [redacted]w[redacted]d Viable Singleton Intrauterine pregnancy previously established criteria. 2. Growth is 16th %ile.  AFI is 8.47 cm. Recommendations: 1.Clinical correlation with the patient's History and Physical Exam. Willette Alma, RDMS, RVT There is a singleton gestation with normal amniotic fluid volume. The fetal biometry correlates with established dating.  Limited fetal anatomy was performed.The visualized fetal anatomical survey appears within normal limits within the resolution of ultrasound as described above.  It must be noted that a normal ultrasound is unable to rule out fetal aneuploidy.  [name], MD, [facility], [facility] 11/25/2017, 2:10 PM   US Ob Follow Up  Result Date: 11/13/2017 Patient Name: Amy Ware DOB: September 28, 1995 MRN: [id number] ULTRASOUND REPORT Location: [facility] Date of Service: 10/31/2017 Indications:growth/afi Findings: Mason Jim intrauterine pregnancy is visualized with FHR at 134 BPM. Biometrics give an (U/S) Gestational age of [redacted]w[redacted]d and an (U/S) EDD of 12/26/2017; this correlates with the clinically established Estimated Date of Delivery: 12/19/17. Fetal presentation is  Cephalic. Placenta: Anterior, Grade 1. AFI: 5.67  Cm, Low normal but 2 x 2 pocket seen. Growth percentile is 28 %. EFW: 1857 g (4lb2oz) Impression: 1. [redacted]w[redacted]d Viable Singleton Intrauterine pregnancy previously established criteria. 2.  Growth is 28 %ile.  AFI is 5.67 cm. 3. AFI is low normal but 2 x 2 pocket is seen. Recommendations: 1.Clinical correlation with the patient's History and Physical Exam. Mital bahen Leodis Binet, RDMS The ultrasound images and findings were reviewed by me and I agree with the above report. Thomasene Mohair, MD, [facility], [facility] 11/13/2017 1:33 PM      Assessment   21 y.o. G2P1001 at [redacted]w[redacted]d by  12/19/2017, by Last Menstrual Period presenting for routine prenatal visit  Plan   Pregnancy #2 Problems (from 03/14/17 to present)    Problem Noted Resolved   Preterm labor in third trimester without delivery 11/12/2017 by Conard Novak, MD No   Supervision of high risk pregnancy, antepartum 04/28/2017 by Tresea Mall, CNM No   Overview Addendum 09/30/2017  1:17 PM by Oswaldo Conroy, CNM    Clinic [facility] Prenatal Labs  Dating  Blood type: O/Positive/-- (11/12 1011)   Genetic Screen 1 Screen: neg    AFP:      Antibody:Negative (11/12 1011)  Anatomic [place] Low lying placenta: resolved at 28 weeks Rubella: 3.29 (11/12 1011) Varicella: Immune  GTT Third trimester: 136 RPR: Non Reactive (11/12 1011)   Rhogam  HBsAg: Negative (11/12 1011)   TDaP vaccine                       Flu Shot: HIV:     Baby Food                                GBS:   Contraception  Pap:  CBB     CS/VBAC NA   Support Person Partner: Javon          BMI 40.0-44.9, adult (HCC) 04/28/2017 by Tresea Mall, CNM No   Low-lying placenta 08/04/2017 by [name], MD 09/29/2017 by Oswaldo Conroy, CNM       Gestational age appropriate obstetric precautions including but not limited to vaginal bleeding, contractions, leaking of fluid and fetal movement were reviewed in  detail with the patient.   - reactive NST today - GBS and aptima collected - normal growth and AFI Return in about 1 week (around 12/02/2017) for NST/AFI.  [name], MD, [facility], [facility] 11/25/2017, 5:41 PM

## 2017-11-27 LAB — GC/CHLAMYDIA PROBE AMP
CHLAMYDIA, DNA PROBE: NEGATIVE
Neisseria gonorrhoeae by PCR: NEGATIVE

## 2017-11-27 LAB — STREP GP B NAA: Strep Gp B NAA: POSITIVE — AB

## 2017-12-02 ENCOUNTER — Encounter: Payer: Medicaid Other | Admitting: Obstetrics & Gynecology

## 2017-12-02 ENCOUNTER — Observation Stay
Admission: EM | Admit: 2017-12-02 | Discharge: 2017-12-02 | Disposition: A | Discharge status: Home / Self Care | Payer: Medicaid Other | Attending: Certified Nurse Midwife | Admitting: Certified Nurse Midwife

## 2017-12-02 ENCOUNTER — Other Ambulatory Visit: Payer: Self-pay

## 2017-12-02 ENCOUNTER — Other Ambulatory Visit: Payer: Medicaid Other

## 2017-12-02 DIAGNOSIS — O471 False labor at or after 37 completed weeks of gestation: Secondary | ICD-10-CM | POA: Diagnosis present

## 2017-12-02 DIAGNOSIS — Z3A37 37 weeks gestation of pregnancy: Secondary | ICD-10-CM | POA: Insufficient documentation

## 2017-12-02 DIAGNOSIS — O0990 Supervision of high risk pregnancy, unspecified, unspecified trimester: Secondary | ICD-10-CM

## 2017-12-02 DIAGNOSIS — Z6841 Body Mass Index (BMI) 40.0 and over, adult: Secondary | ICD-10-CM

## 2017-12-02 LAB — TYPE AND SCREEN
ABO/RH(D): O POS
Antibody Screen: NEGATIVE

## 2017-12-02 MED ORDER — LACTATED RINGERS IV SOLN: 500.0000 mL | INTRAVENOUS | Status: DC | PRN

## 2017-12-02 NOTE — OB Triage Note (Signed)
Pt presents c/o ctx for the past 3 days. Pt states she has not been timing ctx. Denies any bleeding or LOF. Reports positive fetal movement but minimal. Vitals WNL. Will continue to monitor.

## 2017-12-02 NOTE — Final Progress Note (Signed)
Physician Final Progress Note  Patient ID: Amy Ware MRN: [id number] DOB/AGE: 12-04-1995 22 y.o.  Admit date: 12/02/2017 Admitting provider: Vena AustriaAndreas Noam Karaffa, MD Discharge date: 12/02/2017   Admission Diagnoses: Irregular contractions  Discharge Diagnoses:  Braxton hicks contractions  22 year old G2P1001 at 2041w4d presenting with leaking fluid, negative for rupture on exam.  Monitored over 3-hrs with no cervical change noted.  +FM no VB.  Discharge home follow up next week in clinic  Consults: None  Significant Findings/ Diagnostic Studies: none  Procedures:  Baseline: 120 Variability: moderate Accelerations: present Decelerations: absent Tocometry: irregular, every 8-10 minutes Heart rate tracing was deemed reactive, category I tracing,  Discharge Condition: good  Disposition: Discharge disposition: 01-Home or Self Care       Diet: Regular diet  Discharge Activity: Activity as tolerated  Discharge Instructions    Discharge activity:  No Restrictions   Complete by:  As directed    Discharge diet:  No restrictions   Complete by:  As directed    Fetal Kick Count:  Lie on our left side for one hour after a meal, and count the number of times your baby kicks.  If it is less than 5 times, get up, move around and drink some juice.  Repeat the test 30 minutes later.  If it is still less than 5 kicks in an hour, notify your doctor.   Complete by:  As directed    LABOR:  When conractions begin, you should start to time them from the beginning of one contraction to the beginning  of the next.  When contractions are 5 - 10 minutes apart or less and have been regular for at least an hour, you should call your health care provider.   Complete by:  As directed    No sexual activity restrictions   Complete by:  As directed    Notify physician for bleeding from the vagina   Complete by:  As directed    Notify physician for blurring of vision or spots before the eyes   Complete  by:  As directed    Notify physician for chills or fever   Complete by:  As directed    Notify physician for fainting spells, "black outs" or loss of consciousness   Complete by:  As directed    Notify physician for increase in vaginal discharge   Complete by:  As directed    Notify physician for leaking of fluid   Complete by:  As directed    Notify physician for pain or burning when urinating   Complete by:  As directed    Notify physician for pelvic pressure (sudden increase)   Complete by:  As directed    Notify physician for severe or continued nausea or vomiting   Complete by:  As directed    Notify physician for sudden gushing of fluid from the vagina (with or without continued leaking)   Complete by:  As directed    Notify physician for sudden, constant, or occasional abdominal pain   Complete by:  As directed    Notify physician if baby moving less than usual   Complete by:  As directed      Allergies as of 12/02/2017   No Known Allergies     Medication List    TAKE these medications   prenatal multivitamin Tabs tablet Take 1 tablet by mouth daily at 12 noon.      Follow-up Information    [facility] Follow  up in 1 week(s).   Contact information: [address] [place] [address] [phone]          Total time spent taking care of this patient: 30 minutes  Signed: [name] 12/02/2017, 2:15 PM

## 2017-12-02 NOTE — Discharge Summary (Signed)
Reviewed discharge instructions with patient. Educated patient on signs and symptoms of labor. Gave patient opportunity for questions. All questions answered. Pt verbalized understanding. Pt discharged home.

## 2017-12-02 NOTE — Discharge Summary (Signed)
See final progress note. 

## 2017-12-09 ENCOUNTER — Other Ambulatory Visit: Payer: Self-pay

## 2017-12-09 ENCOUNTER — Inpatient Hospital Stay: Payer: Medicaid Other | Admitting: Certified Registered Nurse Anesthetist

## 2017-12-09 ENCOUNTER — Inpatient Hospital Stay
Admission: EM | Admit: 2017-12-09 | Discharge: 2017-12-11 | DRG: 806 | Disposition: A | Discharge status: Home / Self Care | Payer: Medicaid Other | Attending: Obstetrics & Gynecology | Admitting: Obstetrics & Gynecology

## 2017-12-09 DIAGNOSIS — O9081 Anemia of the puerperium: Secondary | ICD-10-CM | POA: Diagnosis not present

## 2017-12-09 DIAGNOSIS — O99824 Streptococcus B carrier state complicating childbirth: Principal | ICD-10-CM | POA: Diagnosis present

## 2017-12-09 DIAGNOSIS — Z3A38 38 weeks gestation of pregnancy: Secondary | ICD-10-CM

## 2017-12-09 DIAGNOSIS — Z87891 Personal history of nicotine dependence: Secondary | ICD-10-CM | POA: Diagnosis not present

## 2017-12-09 DIAGNOSIS — Z6841 Body Mass Index (BMI) 40.0 and over, adult: Secondary | ICD-10-CM

## 2017-12-09 DIAGNOSIS — O99214 Obesity complicating childbirth: Secondary | ICD-10-CM | POA: Diagnosis present

## 2017-12-09 DIAGNOSIS — D62 Acute posthemorrhagic anemia: Secondary | ICD-10-CM | POA: Diagnosis not present

## 2017-12-09 DIAGNOSIS — Z3483 Encounter for supervision of other normal pregnancy, third trimester: Secondary | ICD-10-CM | POA: Diagnosis present

## 2017-12-09 DIAGNOSIS — O0990 Supervision of high risk pregnancy, unspecified, unspecified trimester: Secondary | ICD-10-CM

## 2017-12-09 HISTORY — DX: Morbid (severe) obesity due to excess calories: E66.01

## 2017-12-09 HISTORY — DX: Tubulo-interstitial nephritis, not specified as acute or chronic: N12

## 2017-12-09 LAB — TYPE AND SCREEN
ABO/RH(D): O POS
Antibody Screen: NEGATIVE

## 2017-12-09 LAB — CBC
HCT: 36.3 % (ref 35.0–47.0)
Hemoglobin: 12.9 g/dL (ref 12.0–16.0)
MCH: 30.4 pg (ref 26.0–34.0)
MCHC: 35.6 g/dL (ref 32.0–36.0)
MCV: 85.5 fL (ref 80.0–100.0)
PLATELETS: 212 10*3/uL (ref 150–440)
RBC: 4.25 MIL/uL (ref 3.80–5.20)
RDW: 15.1 % — ABNORMAL HIGH (ref 11.5–14.5)
WBC: 10.2 10*3/uL (ref 3.6–11.0)

## 2017-12-09 MED ORDER — SODIUM CHLORIDE 0.9 % IV SOLN: 250.0000 mL | INTRAVENOUS | Status: DC | PRN

## 2017-12-09 MED ORDER — BENZOCAINE-MENTHOL 20-0.5 % EX AERO
INHALATION_SPRAY | CUTANEOUS | Status: AC
  Administered 2017-12-09: 09:00:00
  Filled 2017-12-09: qty 56

## 2017-12-09 MED ORDER — IBUPROFEN 600 MG PO TABS
600.0000 mg | ORAL_TABLET | Freq: Four times a day (QID) | ORAL | Status: DC
  Administered 2017-12-09 – 2017-12-11 (×8): 600 mg via ORAL
  Filled 2017-12-09 (×9): qty 1

## 2017-12-09 MED ORDER — LIDOCAINE HCL (PF) 1 % IJ SOLN
INTRAMUSCULAR | Status: DC | PRN
  Administered 2017-12-09: 3 mL via INTRADERMAL

## 2017-12-09 MED ORDER — BENZOCAINE-MENTHOL 20-0.5 % EX AERO
1.0000 "application " | INHALATION_SPRAY | CUTANEOUS | Status: DC | PRN
  Administered 2017-12-10: 1 via TOPICAL
  Filled 2017-12-09: qty 56

## 2017-12-09 MED ORDER — BUTORPHANOL TARTRATE 1 MG/ML IJ SOLN
1.0000 mg | INTRAMUSCULAR | Status: DC | PRN
  Administered 2017-12-09: 1 mg via INTRAVENOUS
  Filled 2017-12-09: qty 1

## 2017-12-09 MED ORDER — LIDOCAINE HCL (PF) 1 % IJ SOLN
INTRAMUSCULAR | Status: AC
  Filled 2017-12-09: qty 30

## 2017-12-09 MED ORDER — ZOLPIDEM TARTRATE 5 MG PO TABS: 5.0000 mg | ORAL_TABLET | Freq: Every evening | ORAL | Status: DC | PRN

## 2017-12-09 MED ORDER — SODIUM CHLORIDE 0.9% FLUSH
3.0000 mL | Freq: Two times a day (BID) | INTRAVENOUS | Status: DC
  Administered 2017-12-09: 3 mL via INTRAVENOUS

## 2017-12-09 MED ORDER — SODIUM CHLORIDE 0.9 % IV SOLN
2.0000 g | Freq: Once | INTRAVENOUS | Status: AC
  Administered 2017-12-09: 2 g via INTRAVENOUS
  Filled 2017-12-09: qty 2000

## 2017-12-09 MED ORDER — FENTANYL 2.5 MCG/ML W/ROPIVACAINE 0.15% IN NS 100 ML EPIDURAL (ARMC)
EPIDURAL | Status: DC | PRN
  Administered 2017-12-09: 12 mL/h via EPIDURAL

## 2017-12-09 MED ORDER — SODIUM CHLORIDE 0.9% FLUSH: 3.0000 mL | INTRAVENOUS | Status: DC | PRN

## 2017-12-09 MED ORDER — LIDOCAINE-EPINEPHRINE (PF) 1.5 %-1:200000 IJ SOLN
INTRAMUSCULAR | Status: DC | PRN
  Administered 2017-12-09: 3 mL via PERINEURAL

## 2017-12-09 MED ORDER — MISOPROSTOL 200 MCG PO TABS
ORAL_TABLET | ORAL | Status: AC
  Filled 2017-12-09: qty 4

## 2017-12-09 MED ORDER — SIMETHICONE 80 MG PO CHEW: 80.0000 mg | CHEWABLE_TABLET | ORAL | Status: DC | PRN

## 2017-12-09 MED ORDER — FENTANYL 2.5 MCG/ML W/ROPIVACAINE 0.15% IN NS 100 ML EPIDURAL (ARMC)
EPIDURAL | Status: AC
  Filled 2017-12-09: qty 100

## 2017-12-09 MED ORDER — OXYTOCIN BOLUS FROM INFUSION
500.0000 mL | Freq: Once | INTRAVENOUS | Status: AC
  Administered 2017-12-09: 500 mL via INTRAVENOUS

## 2017-12-09 MED ORDER — ONDANSETRON HCL 4 MG PO TABS: 4.0000 mg | ORAL_TABLET | ORAL | Status: DC | PRN

## 2017-12-09 MED ORDER — SODIUM CHLORIDE 0.9 % IV SOLN
1.0000 g | INTRAVENOUS | Status: DC
  Filled 2017-12-09 (×4): qty 1000

## 2017-12-09 MED ORDER — LIDOCAINE HCL (PF) 1 % IJ SOLN
30.0000 mL | INTRAMUSCULAR | Status: DC | PRN
  Administered 2017-12-09: 30 mL via SUBCUTANEOUS

## 2017-12-09 MED ORDER — OXYTOCIN 10 UNIT/ML IJ SOLN
INTRAMUSCULAR | Status: AC
  Filled 2017-12-09: qty 2

## 2017-12-09 MED ORDER — LACTATED RINGERS IV SOLN
INTRAVENOUS | Status: DC
  Administered 2017-12-09: 06:00:00 via INTRAVENOUS

## 2017-12-09 MED ORDER — DIPHENHYDRAMINE HCL 25 MG PO CAPS: 25.0000 mg | ORAL_CAPSULE | Freq: Four times a day (QID) | ORAL | Status: DC | PRN

## 2017-12-09 MED ORDER — AMMONIA AROMATIC IN INHA
RESPIRATORY_TRACT | Status: AC
  Filled 2017-12-09: qty 10

## 2017-12-09 MED ORDER — ONDANSETRON HCL 4 MG/2ML IJ SOLN: 4.0000 mg | Freq: Four times a day (QID) | INTRAMUSCULAR | Status: DC | PRN

## 2017-12-09 MED ORDER — ONDANSETRON HCL 4 MG/2ML IJ SOLN: 4.0000 mg | INTRAMUSCULAR | Status: DC | PRN

## 2017-12-09 MED ORDER — COCONUT OIL OIL
1.0000 | TOPICAL_OIL | Status: DC | PRN
Start: 2017-12-09 — End: 2017-12-11

## 2017-12-09 MED ORDER — WITCH HAZEL-GLYCERIN EX PADS: 1.0000 "application " | MEDICATED_PAD | CUTANEOUS | Status: DC | PRN

## 2017-12-09 MED ORDER — MISOPROSTOL 200 MCG PO TABS
600.0000 ug | ORAL_TABLET | Freq: Once | ORAL | Status: AC
  Administered 2017-12-09: 600 ug via ORAL

## 2017-12-09 MED ORDER — BUPIVACAINE HCL (PF) 0.25 % IJ SOLN
INTRAMUSCULAR | Status: DC | PRN
  Administered 2017-12-09: 5 mL via EPIDURAL

## 2017-12-09 MED ORDER — SENNOSIDES-DOCUSATE SODIUM 8.6-50 MG PO TABS
2.0000 | ORAL_TABLET | ORAL | Status: DC
  Administered 2017-12-10: 2 via ORAL
  Filled 2017-12-09: qty 2

## 2017-12-09 MED ORDER — ACETAMINOPHEN 325 MG PO TABS: 650.0000 mg | ORAL_TABLET | ORAL | Status: DC | PRN

## 2017-12-09 MED ORDER — OXYCODONE-ACETAMINOPHEN 5-325 MG PO TABS
2.0000 | ORAL_TABLET | ORAL | Status: DC | PRN
  Administered 2017-12-09 – 2017-12-11 (×6): 2 via ORAL
  Filled 2017-12-09 (×6): qty 2

## 2017-12-09 MED ORDER — OXYCODONE-ACETAMINOPHEN 5-325 MG PO TABS
1.0000 | ORAL_TABLET | ORAL | Status: DC | PRN
  Administered 2017-12-09 – 2017-12-11 (×5): 1 via ORAL
  Filled 2017-12-09 (×5): qty 1

## 2017-12-09 MED ORDER — DIBUCAINE 1 % RE OINT: 1.0000 "application " | TOPICAL_OINTMENT | RECTAL | Status: DC | PRN

## 2017-12-09 MED ORDER — LACTATED RINGERS IV SOLN: 500.0000 mL | INTRAVENOUS | Status: DC | PRN

## 2017-12-09 MED ORDER — OXYTOCIN 40 UNITS IN LACTATED RINGERS INFUSION - SIMPLE MED
2.5000 [IU]/h | INTRAVENOUS | Status: DC
  Filled 2017-12-09: qty 1000

## 2017-12-09 NOTE — Anesthesia Preprocedure Evaluation (Signed)
Anesthesia Evaluation  Patient identified by MRN, date of birth, ID band Patient awake    Reviewed: Allergy & Precautions, NPO status , Patient's Chart, lab work & pertinent test results  History of Anesthesia Complications Negative for: history of anesthetic complications  Airway Mallampati: II  TM Distance: <3 FB Neck ROM: full  Mouth opening: Limited Mouth Opening  Dental no notable dental hx.    Pulmonary former smoker,    Pulmonary exam normal        Cardiovascular Exercise Tolerance: Good negative cardio ROS Normal cardiovascular exam     Neuro/Psych  Headaches, Anxiety    GI/Hepatic negative GI ROS, Neg liver ROS,   Endo/Other  negative endocrine ROS  Renal/GU negative Renal ROS  negative genitourinary   Musculoskeletal   Abdominal   Peds  Hematology negative hematology ROS (+)   Anesthesia Other Findings   Reproductive/Obstetrics (+) Pregnancy                             Anesthesia Physical Anesthesia Plan  ASA: II  Anesthesia Plan: Epidural   Post-op Pain Management:    Induction:   PONV Risk Score and Plan:   Airway Management Planned:   Additional Equipment:   Intra-op Plan:   Post-operative Plan:   Informed Consent: I have reviewed the patients History and Physical, chart, labs and discussed the procedure including the risks, benefits and alternatives for the proposed anesthesia with the patient or authorized representative who has indicated his/her understanding and acceptance.   Dental Advisory Given  Plan Discussed with: Anesthesiologist  Anesthesia Plan Comments:         Anesthesia Quick Evaluation

## 2017-12-09 NOTE — Anesthesia Procedure Notes (Signed)
Epidural Patient location during procedure: OB Start time: 12/09/2017 7:20 AM End time: 12/09/2017 7:36 AM  Staffing Anesthesiologist: Naomie DeanKephart, William K, MD Resident/CRNA: Ginger CarneMichelet, Jarrah Babich, CRNA Performed: resident/CRNA   Preanesthetic Checklist Completed: patient identified, site marked, surgical consent, pre-op evaluation, timeout performed, IV checked, risks and benefits discussed and monitors and equipment checked  Epidural Patient position: sitting Prep: ChloraPrep Patient monitoring: heart rate, continuous pulse ox and blood pressure Approach: midline Location: L3-L4 Injection technique: LOR saline  Needle:  Needle type: Tuohy  Needle gauge: 17 G Needle length: 9 cm and 9 Needle insertion depth: 6 cm Catheter type: closed end flexible Catheter size: 19 Gauge Catheter at skin depth: 12 cm Test dose: negative and 1.5% lidocaine with Epi 1:200 K  Assessment Sensory level: T10 Events: blood not aspirated, injection not painful, no injection resistance, negative IV test and no paresthesia  Additional Notes 1 attempt Pt. Evaluated and documentation done after procedure finished. Patient identified. Risks/Benefits/Options discussed with patient including but not limited to bleeding, infection, nerve damage, paralysis, failed block, incomplete pain control, headache, blood pressure changes, nausea, vomiting, reactions to medication both or allergic, itching and postpartum back pain. Confirmed with bedside nurse the patient's most recent platelet count. Confirmed with patient that they are not currently taking any anticoagulation, have any bleeding history or any family history of bleeding disorders. Patient expressed understanding and wished to proceed. All questions were answered. Sterile technique was used throughout the entire procedure. Please see nursing notes for vital signs. Test dose was given through epidural catheter and negative prior to continuing to dose epidural or  start infusion. Warning signs of high block given to the patient including shortness of breath, tingling/numbness in hands, complete motor block, or any concerning symptoms with instructions to call for help. Patient was given instructions on fall risk and not to get out of bed. All questions and concerns addressed with instructions to call with any issues or inadequate analgesia.   Patient tolerated the insertion well without immediate complications.Reason for block:procedure for pain

## 2017-12-09 NOTE — OB Triage Note (Signed)
Pt presents to triage w/ c/o vaginal bleeding and ctx. Pt states vaginal bleeding and ctx started after sexual intercourse around 0430. Pt states the vaginal bleeding is bright red and "heavy" like period. Pt states contractions are about 7 minutes apart. Pt states positive fetal movement and denies vaginal bleeding. Pt rates her pain a 6/10. Monitors applied and assessing.

## 2017-12-09 NOTE — H&P (Signed)
OB History & Physical   History of Present Illness:  Chief Complaint:   HPI:  LAQUANDA BICK is a 22 y.o. G35P1001 female with EDC=12/19/2017 at [redacted]w[redacted]d dated by LMP=8wk2d ultrasound.  Her pregnancy has been complicated by preterm labor in the third trimester (received betamethasone 4/25, 4/26), morbid obesity with current BMI=45.31 kg, and GBS positive..  She presents to L&D for evaluation of contractions and bleeding.   She reports strong contractions and vaginal bleeding starting around 4:30, about 30 minutes after having had intercourse. She noticed the bleeding when wiping after urination. She states the contractions were about every 7 minutes at home but have increased in frequency and intensity since arrival to hospital.   Prenatal care site: Prenatal care at [facility] has been remarkable for  [facility] Prenatal Labs  Dating LMP=8wk2d ultrasound Blood type: O/Positive/-- (11/12 1011)   Genetic Screen 1 Screen: neg    AFP:      Antibody:Negative (11/12 1011)  Anatomic [place] Low lying placenta: resolved at 28 weeks Rubella: 3.29 (11/12 1011) Varicella: Immune  GTT Third trimester: 136 RPR: Non Reactive (11/12 1011)   Rhogam N.A HBsAg: Negative (11/12 1011)   TDaP vaccine        10/13/2017               Flu Shot: HIV:  negative   Baby Food      Bottle                          [id number]  Contraception  Pap:  CBB     CS/VBAC NA   Support Person Partner: Child psychotherapist          Maternal Medical History:   Past Medical History:  Diagnosis Date  . Headache   . Low-lying placenta 08/04/2017  . Morbid obesity (HCC)   . Pyelonephritis 07/2016    Past Surgical History:  Procedure Laterality Date  . NO PAST SURGERIES      No Known Allergies  Prior to Admission medications   Medication Sig Start Date End Date Taking? Authorizing Provider  Prenatal Vit-Fe Fumarate-FA (PRENATAL MULTIVITAMIN) TABS tablet Take 1 tablet by mouth daily at 12 noon.   Yes [provider]       Social History: She  reports that she quit smoking about 2 months ago. Her smoking use included cigarettes. She has a 0.75 pack-year smoking history. She has never used smokeless tobacco. She reports that she does not drink alcohol or use drugs.  Family History: family history includes Breast cancer in her other; Diabetes in her maternal uncle.   Review of Systems: Negative x 10 systems reviewed except as noted in the HPI.      Physical Exam:  Vital Signs: BP 132/81 (BP Location: Left Arm)   Pulse 77   Temp 98.1 F (36.7 C) (Oral)   Resp 18   Ht 5' (1.524 m)   Wt 105.2 kg (232 lb)   LMP 03/14/2017 (Exact Date)   BMI 45.31 kg/m  General: breathing thru contractions, standing at side of bed HEENT: normocephalic, atraumatic Heart: regular rate & rhythm.  No murmurs/rubs/gallops Lungs: clear to auscultation bilaterally Abdomen: soft, gravid, non-tender;  EFW: 6 1/2# Pelvic:   External: Normal external female genitalia  Cervix: Dilation: 5 / Effacement (%): 80 / Station: -1 , small amount of blood on examiner's glove  Extremities: non-tender, symmetric, trace edema bilaterally.  DTRs:  Neurologic: Alert & oriented x 3.  Baseline FHR: 135 with accelerations to 150s, moderate variability Toco: contractions every 1-3 minutes apart Bedside Ultrasound:  Presentation: LOT  Fluid: decreased subjectively  Placental Location: fundal  Assessment:  Nestor Ramplicia D Heard is a 22 y.o. 172P1001 female at 2864w4d in active labor with a bloody show. FWB: Cat 1   Plan:  1. Admit to Labor & Delivery  -anticipate vaginal delivery 2. CBC, T&S, Clrs, IVF 3. GBS positive-start Ampicillin.   4. Consents obtained. 5. Stadol for pain  Farrel ConnersColleen Daphney Hopke  12/09/2017 6:09 AM

## 2017-12-09 NOTE — Discharge Summary (Signed)
OB Discharge Summary     Patient Name: Amy Ware DOB: [birth year] MRN: [id number]  Date of admission: 12/09/2017 Delivering MD: Farrel Conners, CNM  Date of Delivery: 12/09/2017  Date of discharge: 12/11/2017  Admitting diagnosis: 39 Weeks Contractions Intrauterine pregnancy: [redacted]w[redacted]d     Secondary diagnosis: None     Discharge diagnosis: Term Pregnancy Delivered, Obesity, prior preterm labor                         Hospital course:  Onset of Labor With Vaginal Delivery     22 y.o. yo G2P1001 at [redacted]w[redacted]d was admitted in Active Labor on 12/09/2017. Patient had an uncomplicated labor course as follows:  Membrane Rupture Time/Date: 7:40 AM ,12/09/2017   Intrapartum Procedures: Episiotomy:   None                                        Lacerations:    Periurethral Patient had a delivery of a Viable infant.  One application with delivery by flat Kiwi vacuum extraction due to recurrent and prolonged decels. 12/09/2017  Information for the patient's newborn:  Amy, Ware [[id number]]  Delivery Method: Vag-Spont   Pateint had an uncomplicated postpartum course.  She is ambulating, tolerating a regular diet, passing flatus, and urinating well. Patient is discharged home in stable condition on 12/11/17.                                                                  Post partum procedures:none  Complications: None  Physical exam on 12/11/2017: Vitals:   12/10/17 0758 12/10/17 1605 12/10/17 2248 12/11/17 0850  BP: 123/88 130/86 137/84 132/76  Pulse: 70 78 69 70  Resp: 18  20 18   Temp: 97.7 F (36.5 C) 98 F (36.7 C) 97.7 F (36.5 C) 98.3 F (36.8 C)  TempSrc: Oral Oral Oral Oral  SpO2: 100%  100% (!) 89%  Weight:      Height:       General: alert, cooperative and no distress Lochia: appropriate Uterine Fundus: firm/ U-1/ML/NT Incision: Periurethral area without swelling or hematoma DVT Evaluation: No evidence of DVT seen on physical exam.  Labs: Lab Results  Component Value  Date   WBC 7.0 12/10/2017   HGB 11.0 (L) 12/10/2017   HCT 31.3 (L) 12/10/2017   MCV 86.1 12/10/2017   PLT 152 12/10/2017   CMP Latest Ref Rng & Units 07/06/2016  Glucose 65 - 99 mg/dL 99  BUN 6 - 20 mg/dL 7  Creatinine 7.82 - 9.56 mg/dL 2.13  Sodium 086 - 578 mmol/L 136  Potassium 3.5 - 5.1 mmol/L 3.7  Chloride 101 - 111 mmol/L 108  CO2 22 - 32 mmol/L 21(L)  Calcium 8.9 - 10.3 mg/dL 7.8(L)  Total Protein 6.4 - 8.6 g/dL -  Total Bilirubin 0.2 - 1.0 mg/dL -  Alkaline Phos 469 - 283 Unit/L -  AST 15 - 37 Unit/L -  ALT 12 - 78 U/L -    Discharge instruction: per After Visit Summary.  Medications:  Allergies as of 12/11/2017   No Known Allergies     Medication List  TAKE these medications   ibuprofen 600 MG tablet Commonly known as:  ADVIL,MOTRIN Take 1 tablet (600 mg total) by mouth every 6 (six) hours as needed.   oxyCODONE-acetaminophen 5-325 MG tablet Commonly known as:  PERCOCET/ROXICET Take 1 tablet by mouth every 6 (six) hours as needed for up to 5 days for moderate pain or severe pain.   prenatal multivitamin Tabs tablet Take 1 tablet by mouth daily at 12 noon.       Diet: routine diet  Activity: Advance as tolerated. Pelvic rest for 6 weeks.   Outpatient follow up: Follow-up Information    Nadara MustardHarris, Robert P, MD. Schedule an appointment as soon as possible for a visit in 6 week(s).   Specialty:  Obstetrics and Gynecology Contact information: [address] [place] [address] [phone]             Postpartum contraception: Nexplanon Rhogam Given postpartum: no Rubella vaccine given postpartum: no Varicella vaccine given postpartum: no TDaP given antepartum or postpartum: Yes, AP  Newborn Data: Live born child / Jkain Birth Weight: 6 lb 8.4 oz (2960 g) APGAR: 7,8  Newborn Delivery   Birth date/time:  12/09/2017 08:15:00 Delivery type:  Vaginal, Vacuum (Extractor)      Baby Feeding: Bottle and Breast (pumping and formula  feeding)  Disposition:home with mother  SIGNED:  Farrel ConnersColleen Taysean Wager, CNM 12/11/2017 11:01 AM

## 2017-12-10 LAB — CBC
HCT: 31.3 % — ABNORMAL LOW (ref 35.0–47.0)
Hemoglobin: 11 g/dL — ABNORMAL LOW (ref 12.0–16.0)
MCH: 30.3 pg (ref 26.0–34.0)
MCHC: 35.1 g/dL (ref 32.0–36.0)
MCV: 86.1 fL (ref 80.0–100.0)
PLATELETS: 152 10*3/uL (ref 150–440)
RBC: 3.64 MIL/uL — ABNORMAL LOW (ref 3.80–5.20)
RDW: 15.3 % — AB (ref 11.5–14.5)
WBC: 7 10*3/uL (ref 3.6–11.0)

## 2017-12-10 LAB — RPR: RPR Ser Ql: NONREACTIVE

## 2017-12-10 NOTE — Anesthesia Postprocedure Evaluation (Signed)
Anesthesia Post Note  Patient: Amy Ware  Procedure(s) Performed: AN AD HOC LABOR EPIDURAL  Patient location during evaluation: Mother Baby Anesthesia Type: Epidural Level of consciousness: awake and alert and oriented Pain management: satisfactory to patient Vital Signs Assessment: post-procedure vital signs reviewed and stable Respiratory status: respiratory function stable Cardiovascular status: blood pressure returned to baseline Postop Assessment: epidural receding, no headache, no backache, no apparent nausea or vomiting, adequate PO intake, able to ambulate and patient able to bend at knees Anesthetic complications: no     Last Vitals:  Vitals:   12/09/17 2103 12/09/17 2333  BP: 128/81 111/64  Pulse:  70  Resp: 18 18  Temp: 36.7 C 36.8 C  SpO2: 99% 98%    Last Pain:  Vitals:   12/10/17 0550  TempSrc:   PainSc: Thomasene RippleAsleep                 Bethany Cumming D

## 2017-12-10 NOTE — Progress Notes (Signed)
PPD#1 VAVD Subjective:  Resting in bed.  Pain control is adequate with PRN medications. Voiding without difficulty. Tolerating a regular diet. Ambulating well.  Objective:   Blood pressure 123/88, pulse 70, temperature 97.7 F (36.5 C), temperature source Oral, resp. rate 18, height 5' (1.524 m), weight 232 lb (105.2 kg), last menstrual period 03/14/2017, SpO2 100 %.  General: NAD Pulmonary: no increased work of breathing Abdomen: non-distended, non-tender Uterus:  fundus firm; lochia appropriate Extremities: trace edema, no erythema, no tenderness, no signs of DVT  Results for orders placed or performed during the hospital encounter of 12/09/17 (from the past 72 hour(s))  CBC     Status: Abnormal   Collection Time: 12/09/17  6:07 AM  Result Value Ref Range   WBC 10.2 3.6 - 11.0 K/uL   RBC 4.25 3.80 - 5.20 MIL/uL   Hemoglobin 12.9 12.0 - 16.0 g/dL   HCT 02.736.3 25.335.0 - 66.447.0 %   MCV 85.5 80.0 - 100.0 fL   MCH 30.4 26.0 - 34.0 pg   MCHC 35.6 32.0 - 36.0 g/dL   RDW 40.315.1 (H) 47.411.5 - 25.914.5 %   Platelets 212 150 - 440 K/uL    Comment: Performed at [facility], [address] [place], [place] [address]  RPR     Status: None   Collection Time: 12/09/17  6:07 AM  Result Value Ref Range   RPR Ser Ql Non Reactive Non Reactive    Comment: (NOTE) Performed At: [facility] [address] [place], [place] [id number] Jolene SchimkeNagendra Sanjai MD [phone] Performed at [facility], [address] [place], [place] [address]   Type and screen     Status: None   Collection Time: 12/09/17  6:47 AM  Result Value Ref Range   ABO/RH(D) O POS    Antibody Screen NEG    Sample Expiration      12/12/2017 Performed at [facility], [address] [place], [place] [address]   CBC     Status: Abnormal   Collection Time: 12/10/17  6:14 AM  Result Value Ref Range   WBC 7.0 3.6 - 11.0 K/uL   RBC 3.64 (L) 3.80 - 5.20 MIL/uL   Hemoglobin 11.0 (L) 12.0 - 16.0 g/dL   HCT 32.231.3 (L) 02.535.0 - 42.747.0 %   MCV 86.1 80.0 - 100.0 fL   MCH 30.3 26.0 - 34.0 pg   MCHC 35.1 32.0 - 36.0 g/dL   RDW 06.215.3 (H) 37.611.5 - 28.314.5 %   Platelets 152 150 - 440 K/uL    Comment: Performed at [facility], [address] [place], [place] [address]    Assessment:   22 y.o. G2P1001 postpartum day # 1 recovering well.  Plan:   1) Acute blood loss anemia - hemodynamically stable and asymptomatic  2) Blood Type --/--/O POS (06/11 [id number]) /   3) Rubella 3.29 (11/12 1011) / Varicella Immune /TDAP status: given 10/13/2017  4) Breast and formula feeding  5) Contraception: Nexplanon  6) Disposition: continue postpartum care. Anticipate discharge on PPD#2.  Marcelyn BruinsJacelyn Schmid, CNM

## 2017-12-11 MED ORDER — IBUPROFEN 600 MG PO TABS: 600.0000 mg | ORAL_TABLET | Freq: Four times a day (QID) | ORAL | 1 refills | Status: DC | PRN

## 2017-12-11 MED ORDER — OXYCODONE-ACETAMINOPHEN 5-325 MG PO TABS: 1.0000 | ORAL_TABLET | Freq: Four times a day (QID) | ORAL | 0 refills | Status: AC | PRN

## 2017-12-11 NOTE — Discharge Instructions (Signed)
Breast Pumping Tips If you are breastfeeding, there may be times when you cannot feed your baby directly. Returning to work or going on a trip are examples. Pumping allows you to store breast milk and feed it to your baby later. You may not get much milk when you first start to pump. Your breasts should start to make more after a few days. If you pump at the times you usually feed your baby, you may be able to keep making enough milk to feed your baby without also using formula. The more often you pump, the more milk your body will make. When should I pump?  You can start to pump soon after you have your baby. Ask your doctor what is right for you and your baby.  If you are going back to work, start pumping a few weeks before. This gives you time to learn how to pump and to store a supply of milk.  When you are with your baby, feed your baby when he or she is hungry. Pump after each feeding.  When you are away from your baby for many hours, pump for about 15 minutes every 2-3 hours. Pump both breasts at the same time if you can.  If your baby has a formula feeding, make sure to pump close to the same time.  If you drink any alcohol, wait 2 hours before pumping. How do I get ready to pump? Your let-down reflex is your body's natural reaction that makes your breast milk flow. It is easier to make your breast milk flow when you are relaxed. Try these things to help you relax:  Smell one of your baby's blankets or an item of clothing.  Look at a picture or video of your baby.  Sit in a quiet, private space.  Massage the breast you plan to pump.  Place soothing warmth on the breast.  Play relaxing music.  What are some breast pumping tips?  Wash your hands before you pump. You do not need to wash your nipples or breasts.  There are three ways to pump. You can: ? Use your hand to massage and squeeze your breast. ? Use a handheld manual pump. ? Use an electric pump.  Make sure the  suction cup on the breast pump is the right size. Place the suction cup directly over the nipple. It can be painful or hurt your nipple if it is the wrong size or placed wrong.  Put a small amount of purified or modified lanolin on your nipple and areola if you are sore.  If you are using an electric pump, change the speed and suction power to be more comfortable.  You may need a different type of pump if pumping hurts or you do not get a lot of milk. Your doctor can help you pick what type of pump to use.  Keep a full water bottle near you always. Drinking lots of fluid helps you make more milk.  You can store your milk to use later. Pumped breast milk can be stored in a sealable, sterile container or plastic bag. Always put the date you pumped it on the container. ? Milk can stay out at room temperature for up to 8 hours. ? You can store your milk in the refrigerator for up to 8 days. ? You can store your milk in the freezer for 3 months. Thaw frozen milk using warm water. Do not put it in the microwave.  Do not smoke. Ask  your doctor for help. When should I call my doctor?  You have a hard time pumping.  You are worried you do not make enough milk.  You have nipple pain, soreness, or redness.  You want to take birth control pills. This information is not intended to replace advice given to you by your health care provider. Make sure you discuss any questions you have with your health care provider.   Vaginal Delivery, Care After Refer to this sheet in the next few weeks. These instructions provide you with information about caring for yourself after vaginal delivery. Your health care provider may also give you more specific instructions. Your treatment has been planned according to current medical practices, but problems sometimes occur. Call your health care provider if you have any problems or questions. What can I expect after the procedure? After vaginal delivery, it is common  to have:  Some bleeding from your vagina.  Soreness in your abdomen, your vagina, and the area of skin between your vaginal opening and your anus (perineum).  Pelvic cramps.  Fatigue.  Follow these instructions at home: Medicines  Take over-the-counter and prescription medicines only as told by your health care provider.  If you were prescribed an antibiotic medicine, take it as told by your health care provider. Do not stop taking the antibiotic until it is finished. Driving   Do not drive or operate heavy machinery while taking prescription pain medicine.  Do not drive for 24 hours if you received a sedative. Lifestyle  Do not drink alcohol. This is especially important if you are breastfeeding or taking medicine to relieve pain.  Do not use tobacco products, including cigarettes, chewing tobacco, or e-cigarettes. If you need help quitting, ask your health care provider. Eating and drinking  Drink at least 8 eight-ounce glasses of water every day unless you are told not to by your health care provider. If you choose to breastfeed your baby, you may need to drink more water than this.  Eat high-fiber foods every day. These foods may help prevent or relieve constipation. High-fiber foods include: ? Whole grain cereals and breads. ? Brown rice. ? Beans. ? Fresh fruits and vegetables. Activity  Return to your normal activities as told by your health care provider. Ask your health care provider what activities are safe for you.  Rest as much as possible. Try to rest or take a nap when your baby is sleeping.  Do not lift anything that is heavier than your baby or 10 lb (4.5 kg) until your health care provider says that it is safe.  Talk with your health care provider about when you can engage in sexual activity. This may depend on your: ? Risk of infection. ? Rate of healing. ? Comfort and desire to engage in sexual activity. Vaginal Care  If you have an episiotomy or a  vaginal tear, check the area every day for signs of infection. Check for: ? More redness, swelling, or pain. ? More fluid or blood. ? Warmth. ? Pus or a bad smell.  Do not use tampons or douches until your health care provider says this is safe.  Watch for any blood clots that may pass from your vagina. These may look like clumps of dark red, brown, or black discharge. General instructions  Keep your perineum clean and dry as told by your health care provider.  Wear loose, comfortable clothing.  Wipe from front to back when you use the toilet.  Ask your health  care provider if you can shower or take a bath. If you had an episiotomy or a perineal tear during labor and delivery, your health care provider may tell you not to take baths for a certain length of time.  Wear a bra that supports your breasts and fits you well.  If possible, have someone help you with household activities and help care for your baby for at least a few days after you leave the hospital.  Keep all follow-up visits for you and your baby as told by your health care provider. This is important. Contact a health care provider if:  You have: ? Vaginal discharge that has a bad smell. ? Difficulty urinating. ? Pain when urinating. ? A sudden increase or decrease in the frequency of your bowel movements. ? More redness, swelling, or pain around your episiotomy or vaginal tear. ? More fluid or blood coming from your episiotomy or vaginal tear. ? Pus or a bad smell coming from your episiotomy or vaginal tear. ? A fever. ? A rash. ? Little or no interest in activities you used to enjoy. ? Questions about caring for yourself or your baby.  Your episiotomy or vaginal tear feels warm to the touch.  Your episiotomy or vaginal tear is separating or does not appear to be healing.  Your breasts are painful, hard, or turn red.  You feel unusually sad or worried.  You feel nauseous or you vomit.  You pass large  blood clots from your vagina. If you pass a blood clot from your vagina, save it to show to your health care provider. Do not flush blood clots down the toilet without having your health care provider look at them.  You urinate more than usual.  You are dizzy or light-headed.  You have not breastfed at all and you have not had a menstrual period for 12 weeks after delivery.  You have stopped breastfeeding and you have not had a menstrual period for 12 weeks after you stopped breastfeeding. Get help right away if:  You have: ? Pain that does not go away or does not get better with medicine. ? Chest pain. ? Difficulty breathing. ? Blurred vision or spots in your vision. ? Thoughts about hurting yourself or your baby.  You develop pain in your abdomen or in one of your legs.  You develop a severe headache.  You faint.  You bleed from your vagina so much that you fill two sanitary pads in one hour. This information is not intended to replace advice given to you by your health care provider. Make sure you discuss any questions you have with your health care provider.

## 2017-12-11 NOTE — Plan of Care (Signed)
Goals for admission met adequately for discharge and patient states she is ready to go home with no further questions at this time.

## 2018-01-02 ENCOUNTER — Other Ambulatory Visit: Payer: Self-pay

## 2018-01-02 ENCOUNTER — Encounter: Payer: Self-pay | Admitting: Emergency Medicine

## 2018-01-02 ENCOUNTER — Ambulatory Visit: Payer: Medicaid Other | Admitting: Advanced Practice Midwife

## 2018-01-02 ENCOUNTER — Encounter: Payer: Self-pay | Admitting: Advanced Practice Midwife

## 2018-01-02 ENCOUNTER — Emergency Department
Admission: EM | Admit: 2018-01-02 | Discharge: 2018-01-02 | Disposition: A | Discharge status: Home / Self Care | Payer: Medicaid Other | Attending: Emergency Medicine | Admitting: Emergency Medicine

## 2018-01-02 DIAGNOSIS — N939 Abnormal uterine and vaginal bleeding, unspecified: Secondary | ICD-10-CM | POA: Diagnosis not present

## 2018-01-02 DIAGNOSIS — Z5321 Procedure and treatment not carried out due to patient leaving prior to being seen by health care provider: Secondary | ICD-10-CM | POA: Diagnosis not present

## 2018-01-02 DIAGNOSIS — R109 Unspecified abdominal pain: Secondary | ICD-10-CM | POA: Diagnosis present

## 2018-01-02 LAB — COMPREHENSIVE METABOLIC PANEL
ALBUMIN: 3.2 g/dL — AB (ref 3.5–5.0)
ALT: 17 U/L (ref 0–44)
AST: 18 U/L (ref 15–41)
Alkaline Phosphatase: 86 U/L (ref 38–126)
Anion gap: 8 (ref 5–15)
BUN: 13 mg/dL (ref 6–20)
CHLORIDE: 106 mmol/L (ref 98–111)
CO2: 24 mmol/L (ref 22–32)
Calcium: 8.3 mg/dL — ABNORMAL LOW (ref 8.9–10.3)
Creatinine, Ser: 0.68 mg/dL (ref 0.44–1.00)
GFR calc Af Amer: >60 mL/min (ref >60)
GFR calc non Af Amer: >60 mL/min (ref >60)
GLUCOSE: 93 mg/dL (ref 70–99)
POTASSIUM: 4.2 mmol/L (ref 3.5–5.1)
SODIUM: 138 mmol/L (ref 135–145)
Total Bilirubin: 0.3 mg/dL (ref 0.3–1.2)
Total Protein: 6.4 g/dL — ABNORMAL LOW (ref 6.5–8.1)

## 2018-01-02 LAB — CBC
HEMATOCRIT: 35.9 % (ref 35.0–47.0)
Hemoglobin: 12.1 g/dL (ref 12.0–16.0)
MCH: 28.8 pg (ref 26.0–34.0)
MCHC: 33.6 g/dL (ref 32.0–36.0)
MCV: 85.6 fL (ref 80.0–100.0)
Platelets: 248 10*3/uL (ref 150–440)
RBC: 4.19 MIL/uL (ref 3.80–5.20)
RDW: 14.7 % — AB (ref 11.5–14.5)
WBC: 5.4 10*3/uL (ref 3.6–11.0)

## 2018-01-02 LAB — TYPE AND SCREEN
ABO/RH(D): O POS
Antibody Screen: NEGATIVE

## 2018-01-02 LAB — LIPASE, BLOOD: LIPASE: 28 U/L (ref 11–51)

## 2018-01-02 NOTE — ED Notes (Signed)
First Nurse Note: Patient states she recently gave birth and now has abdominal pain and increased vaginal bleeding.  States she had an appt. With her MD today but missed it.

## 2018-01-02 NOTE — ED Triage Notes (Signed)
Pt to ED via POV stating that she has been having abdominal pain since giving birth on June 11th. Pt states that the pain has made her vaginal bleeding worse. Pt states that she is having to wear 2 pads at a time so that she does not bleed through. Pt states that she has been passing blood clots as well. Pt has tried OTC medications without relief. Pt states that she was supposed she see her OB today but she missed the appointment. Pt is in NAD at this time.

## 2018-01-02 NOTE — ED Notes (Signed)
Patient states she has to leave and go to a funeral.  Patient encouraged to return if symptoms do not improve.

## 2018-01-05 ENCOUNTER — Telehealth: Payer: Self-pay | Admitting: Emergency Medicine

## 2018-01-05 NOTE — Telephone Encounter (Signed)
Called patient due to lwot to inquire about condition and follow up plans.  Number is not in service.  

## 2018-01-22 ENCOUNTER — Ambulatory Visit (INDEPENDENT_AMBULATORY_CARE_PROVIDER_SITE_OTHER): Payer: Medicaid Other | Admitting: Obstetrics & Gynecology

## 2018-01-22 ENCOUNTER — Encounter: Payer: Self-pay | Admitting: Obstetrics & Gynecology

## 2018-01-22 DIAGNOSIS — Z3202 Encounter for pregnancy test, result negative: Secondary | ICD-10-CM

## 2018-01-22 DIAGNOSIS — Z3049 Encounter for surveillance of other contraceptives: Secondary | ICD-10-CM | POA: Diagnosis not present

## 2018-01-22 DIAGNOSIS — Z3046 Encounter for surveillance of implantable subdermal contraceptive: Secondary | ICD-10-CM

## 2018-01-22 LAB — POCT URINE PREGNANCY: PREG TEST UR: NEGATIVE

## 2018-01-22 NOTE — Progress Notes (Signed)
  OBSTETRICS POSTPARTUM CLINIC PROGRESS NOTE  Subjective:     Amy Ware is a 22 y.o. 42P2002 female who presents for a postpartum visit. She is 6 weeks postpartum following a Term pregnancy w PTL that was successfully overcome and delivery by VAVD, no complications or concerns.  I have fully reviewed the prenatal and intrapartum course. Anesthesia: epidural.  Postpartum course has been complicated by uncomplicated.  Baby is feeding by Bottle.  Bleeding: patient has not  resumed menses.  Bowel function is normal. Bladder function is normal.  Patient is not sexually active. Contraception method desired is Nexplanon.  Postpartum depression screening: negative. [place] 0.  The following portions of the patient's history were reviewed and updated as appropriate: allergies, current medications, past family history, past medical history, past social history, past surgical history and problem list.  Review of Systems Pertinent items noted in HPI and remainder of comprehensive ROS otherwise negative.  Objective:    BP 120/80   Ht 5' (1.524 m)   Wt 222 lb (100.7 kg)   BMI 43.36 kg/m   General:  alert and no distress   Breasts:  inspection negative, no nipple discharge or bleeding, no masses or nodularity palpable  Lungs: clear to auscultation bilaterally  Heart:  regular rate and rhythm, S1, S2 normal, no murmur, click, rub or gallop  Abdomen: soft, non-tender; bowel sounds normal; no masses,  no organomegaly.     Vulva:  normal  Vagina: normal vagina, no discharge, exudate, lesion, or erythema  Cervix:  no cervical motion tenderness and no lesions  Corpus: normal size, contour, position, consistency, mobility, non-tender  Adnexa:  normal adnexa and no mass, fullness, tenderness  Rectal Exam: Not performed.         Nexplanon Insertion  Patient given informed consent, signed copy in the chart, time out was performed. Appropriate time out taken.  Patient's LEFT arm was prepped and  draped in the usual sterile fashion.. The ruler used to measure and mark insertion area.  Pt was prepped with betadine swab and then injected with 3 cc of 2% lidocaine with epinephrine. Nexplanon removed form packaging,  Device confirmed in needle, then inserted full length of needle and withdrawn per handbook instructions.  Pt insertion site covered with steri-strip and a bandage.   Minimal blood loss.  Pt tolerated the procedure well.    Assessment:  Post Partum Care visit There are no diagnoses linked to this encounter.  Plan:  See orders and Patient Instructions Contraceptive counseling for Nexplanon Resume all normal activities Follow up in: 1 year or as needed.   Annamarie MajorPaul Serene Kopf, MD, Merlinda FrederickFACOG [facility], [facility] 01/22/2018  10:10 AM

## 2018-01-22 NOTE — Patient Instructions (Signed)
Etonogestrel implant What is this medicine? ETONOGESTREL (et oh noe JES trel) is a contraceptive (birth control) device. It is used to prevent pregnancy. It can be used for up to 3 years. This medicine may be used for other purposes; ask your health care provider or pharmacist if you have questions. COMMON BRAND NAME(S): Implanon, Nexplanon What should I tell my health care provider before I take this medicine? They need to know if you have any of these conditions: -abnormal vaginal bleeding -blood vessel disease or blood clots -cancer of the breast, cervix, or liver -depression -diabetes -gallbladder disease -headaches -heart disease or recent heart attack -high blood pressure -high cholesterol -kidney disease -liver disease -renal disease -seizures -tobacco smoker -an unusual or allergic reaction to etonogestrel, other hormones, anesthetics or antiseptics, medicines, foods, dyes, or preservatives -pregnant or trying to get pregnant -breast-feeding How should I use this medicine? This device is inserted just under the skin on the inner side of your upper arm by a health care professional. Talk to your pediatrician regarding the use of this medicine in children. Special care may be needed. Overdosage: If you think you have taken too much of this medicine contact a poison control center or emergency room at once. NOTE: This medicine is only for you. Do not share this medicine with others. What if I miss a dose? This does not apply. What may interact with this medicine? Do not take this medicine with any of the following medications: -amprenavir -bosentan -fosamprenavir This medicine may also interact with the following medications: -barbiturate medicines for inducing sleep or treating seizures -certain medicines for fungal infections like ketoconazole and itraconazole -grapefruit juice -griseofulvin -medicines to treat seizures like carbamazepine, felbamate, oxcarbazepine,  phenytoin, topiramate -modafinil -phenylbutazone -rifampin -rufinamide -some medicines to treat HIV infection like atazanavir, indinavir, lopinavir, nelfinavir, tipranavir, ritonavir -St. John's wort This list may not describe all possible interactions. Give your health care provider a list of all the medicines, herbs, non-prescription drugs, or dietary supplements you use. Also tell them if you smoke, drink alcohol, or use illegal drugs. Some items may interact with your medicine. What should I watch for while using this medicine? This product does not protect you against HIV infection (AIDS) or other sexually transmitted diseases. You should be able to feel the implant by pressing your fingertips over the skin where it was inserted. Contact your doctor if you cannot feel the implant, and use a non-hormonal birth control method (such as condoms) until your doctor confirms that the implant is in place. If you feel that the implant may have broken or become bent while in your arm, contact your healthcare provider. What side effects may I notice from receiving this medicine? Side effects that you should report to your doctor or health care professional as soon as possible: -allergic reactions like skin rash, itching or hives, swelling of the face, lips, or tongue -breast lumps -changes in emotions or moods -depressed mood -heavy or prolonged menstrual bleeding -pain, irritation, swelling, or bruising at the insertion site -scar at site of insertion -signs of infection at the insertion site such as fever, and skin redness, pain or discharge -signs of pregnancy -signs and symptoms of a blood clot such as breathing problems; changes in vision; chest pain; severe, sudden headache; pain, swelling, warmth in the leg; trouble speaking; sudden numbness or weakness of the face, arm or leg -signs and symptoms of liver injury like dark yellow or brown urine; general ill feeling or flu-like symptoms;  light-colored   stools; loss of appetite; nausea; right upper belly pain; unusually weak or tired; yellowing of the eyes or skin -unusual vaginal bleeding, discharge -signs and symptoms of a stroke like changes in vision; confusion; trouble speaking or understanding; severe headaches; sudden numbness or weakness of the face, arm or leg; trouble walking; dizziness; loss of balance or coordination Side effects that usually do not require medical attention (report to your doctor or health care professional if they continue or are bothersome): -acne -back pain -breast pain -changes in weight -dizziness -general ill feeling or flu-like symptoms -headache -irregular menstrual bleeding -nausea -sore throat -vaginal irritation or inflammation This list may not describe all possible side effects. Call your doctor for medical advice about side effects. You may report side effects to FDA at 1-800-FDA-1088. Where should I keep my medicine? This drug is given in a hospital or clinic and will not be stored at home. NOTE: This sheet is a summary. It may not cover all possible information. If you have questions about this medicine, talk to your doctor, pharmacist, or health care provider.  2018 [employer]vier/Gold Standard (2016-01-04 11:19:22)  

## 2018-01-22 NOTE — Addendum Note (Signed)
Addended by: Cornelius MorasPATTERSON, Damonie Furney D on: 01/22/2018 10:27 AM   Modules accepted: Orders
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
## 2019-08-16 ENCOUNTER — Other Ambulatory Visit: Payer: Self-pay

## 2019-08-16 ENCOUNTER — Encounter: Payer: Self-pay | Admitting: Emergency Medicine

## 2019-08-16 ENCOUNTER — Ambulatory Visit
Admission: EM | Admit: 2019-08-16 | Discharge: 2019-08-16 | Disposition: A | Discharge status: Home / Self Care | Payer: Self-pay

## 2019-08-16 DIAGNOSIS — R21 Rash and other nonspecific skin eruption: Secondary | ICD-10-CM | POA: Insufficient documentation

## 2019-08-16 DIAGNOSIS — J029 Acute pharyngitis, unspecified: Secondary | ICD-10-CM

## 2019-08-16 DIAGNOSIS — F17210 Nicotine dependence, cigarettes, uncomplicated: Secondary | ICD-10-CM

## 2019-08-16 LAB — POCT RAPID STREP A (OFFICE): Rapid Strep A Screen: NEGATIVE

## 2019-08-16 MED ORDER — PREDNISONE 10 MG (21) PO TBPK: ORAL_TABLET | Freq: Every day | ORAL | 0 refills | Status: DC

## 2019-08-16 NOTE — ED Triage Notes (Signed)
Patient in today c/o itchy rash since this morning. Patient denies any new soaps, lotions or detergents. Patient has not taken any OTC medications for the rash.

## 2019-08-16 NOTE — ED Provider Notes (Signed)
Amy Ware    CSN: [id number] Arrival date & time: 08/16/19  1100      History   Chief Complaint Chief Complaint  Patient presents with  . Rash  . Pruritis    HPI Amy Ware is a 24 y.o. female.   Patient presents with a pruritic rash on her chest, abdomen, and arms since this morning.  She denies any new medications, products, foods.  She also reports a scratchy throat.  She denies fever, chills, cough, shortness of breath, vomiting, diarrhea, or other symptoms.  No treatments attempted at home.  The history is provided by the patient.    Past Medical History:  Diagnosis Date  . Headache   . Low-lying placenta 08/04/2017  . Morbid obesity (HCC)   . Pyelonephritis 07/2016    Patient Active Problem List   Diagnosis Date Noted  . Postpartum care following vaginal delivery 12/11/2017    Past Surgical History:  Procedure Laterality Date  . NO PAST SURGERIES      OB History    Gravida  2   Para  2   Term  2   Preterm      AB      Living  2     SAB      TAB      Ectopic      Multiple      Live Births  1            Home Medications    Prior to Admission medications   Medication Sig Start Date End Date Taking? Authorizing Provider  etonogestrel (NEXPLANON) 68 MG IMPL implant 1 each by Subdermal route once.   Yes [provider]  ibuprofen (ADVIL,MOTRIN) 600 MG tablet Take 1 tablet (600 mg total) by mouth every 6 (six) hours as needed. 12/11/17   Farrel Conners, CNM  predniSONE (STERAPRED UNI-PAK 21 TAB) 10 MG (21) TBPK tablet Take by mouth daily. Take 6 tabs by mouth daily  for 1 day, then 5 tabs for 1 day, then 4 tabs for 1 day, then 3 tabs for 1 day, 2 tabs for 1 day, then 1 tab by mouth daily for 1 day 08/16/19   Mickie Bail, NP  Prenatal Vit-Fe Fumarate-FA (PRENATAL MULTIVITAMIN) TABS tablet Take 1 tablet by mouth daily at 12 noon.    [provider]    Family History Family History  Problem Relation Age  of Onset  . Diabetes Maternal Uncle   . Breast cancer Other   . Healthy Mother   . Healthy Father   . Breast cancer Maternal Grandmother     Social History Social History   Tobacco Use  . Smoking status: Current Every Day Smoker    Packs/day: 0.25    Years: 5.00    Pack years: 1.25    Types: Cigarettes  . Smokeless tobacco: Never Used  Substance Use Topics  . Alcohol use: Yes    Comment: social  . Drug use: No     Allergies   Patient has no known allergies.   Review of Systems Review of Systems  Constitutional: Negative for chills and fever.  HENT: Positive for sore throat. Negative for congestion, ear pain and rhinorrhea.   Eyes: Negative for pain and visual disturbance.  Respiratory: Negative for cough and shortness of breath.   Cardiovascular: Negative for chest pain and palpitations.  Gastrointestinal: Negative for abdominal pain, diarrhea, nausea and vomiting.  Genitourinary: Negative for dysuria and hematuria.  Musculoskeletal: Negative for arthralgias and back pain.  Skin: Positive for rash. Negative for color change.  Neurological: Negative for seizures and syncope.  All other systems reviewed and are negative.    Physical Exam Triage Vital Signs ED Triage Vitals  Enc Vitals Group     BP      Pulse      Resp      Temp      Temp src      SpO2      Weight      Height      Head Circumference      Peak Flow      Pain Score      Pain Loc      Pain Edu?      Excl. in [place]?    No data found.  Updated Vital Signs BP 113/79 (BP Location: Left Arm)   Pulse 83   Temp 98.6 F (37 C) (Oral)   Resp 18   Ht 5' (1.524 m)   Wt 223 lb 6.4 oz (101.3 kg)   LMP 08/09/2019 (Exact Date)   SpO2 98%   BMI 43.63 kg/m   Visual Acuity Right Eye Distance:   Left Eye Distance:   Bilateral Distance:    Right Eye Near:   Left Eye Near:    Bilateral Near:     Physical Exam Vitals and nursing note reviewed.  Constitutional:      General: She is not in  acute distress.    Appearance: She is well-developed.  HENT:     Head: Normocephalic and atraumatic.     Right Ear: Tympanic membrane normal.     Left Ear: Tympanic membrane normal.     Nose: Nose normal.     Mouth/Throat:     Mouth: Mucous membranes are moist.     Pharynx: Oropharynx is clear.  Eyes:     Conjunctiva/sclera: Conjunctivae normal.  Cardiovascular:     Rate and Rhythm: Normal rate and regular rhythm.     Heart sounds: No murmur.  Pulmonary:     Effort: Pulmonary effort is normal. No respiratory distress.     Breath sounds: Normal breath sounds.  Abdominal:     Palpations: Abdomen is soft.     Tenderness: There is no abdominal tenderness. There is no guarding or rebound.  Musculoskeletal:     Cervical back: Neck supple.  Skin:    General: Skin is warm and dry.     Findings: Rash present.     Comments: Fine, flesh-colored, papular rash on upper arms, chest, upper abdomen.  No erythema or drainage.   Neurological:     General: No focal deficit present.     Mental Status: She is alert and oriented to person, place, and time.  Psychiatric:        Mood and Affect: Mood normal.        Behavior: Behavior normal.      UC Treatments / Results  Labs (all labs ordered are listed, but only abnormal results are displayed) Labs Reviewed  CULTURE, GROUP A STREP [facility])  POCT RAPID STREP A (OFFICE)    EKG   Radiology No results found.  Procedures Procedures (including critical care time)  Medications Ordered in UC Medications - No data to display  Initial Impression / Assessment and Plan / UC Course  I have reviewed the triage vital signs and the nursing notes.  Pertinent labs & imaging results that were available during my care of  the patient were reviewed by me and considered in my medical decision making (see chart for details).    Rash.  Rapid strep negative; throat culture pending.  Treating with prednisone and Benadryl.  Precautions for drowsiness with  Benadryl discussed with patient.  Instructed her to follow-up with her PCP if her symptoms are not improving.  Patient agrees to plan of care.     Final Clinical Impressions(s) / UC Diagnoses   Final diagnoses:  Rash and nonspecific skin eruption     Discharge Instructions     Take the prednisone as directed.  Additionally, take Benadryl every 6 hours; do not drive, operate machinery, or drink alcohol with this medication as it will cause drowsiness.    Follow up with your primary care provider if your symptoms are not improving.       ED Prescriptions    Medication Sig Dispense Auth. Provider   predniSONE (STERAPRED UNI-PAK 21 TAB) 10 MG (21) TBPK tablet Take by mouth daily. Take 6 tabs by mouth daily  for 1 day, then 5 tabs for 1 day, then 4 tabs for 1 day, then 3 tabs for 1 day, 2 tabs for 1 day, then 1 tab by mouth daily for 1 day 21 tablet Mickie Bail, NP     PDMP not reviewed this encounter.   Mickie Bail, NP 08/16/19 1131

## 2019-08-16 NOTE — Discharge Instructions (Addendum)
Take the prednisone as directed.  Additionally, take Benadryl every 6 hours; do not drive, operate machinery, or drink alcohol with this medication as it will cause drowsiness.    Follow up with your primary care provider if your symptoms are not improving.

## 2019-08-18 LAB — CULTURE, GROUP A STREP (THRC)

## 2020-03-08 ENCOUNTER — Other Ambulatory Visit: Payer: Self-pay

## 2020-03-08 DIAGNOSIS — Z20822 Contact with and (suspected) exposure to covid-19: Secondary | ICD-10-CM

## 2020-03-10 LAB — SARS-COV-2, NAA 2 DAY TAT

## 2020-03-10 LAB — NOVEL CORONAVIRUS, NAA: SARS-CoV-2, NAA: NOT DETECTED

## 2020-12-13 ENCOUNTER — Telehealth: Payer: Self-pay

## 2020-12-13 NOTE — Telephone Encounter (Signed)
Patient is scheduled for 01/18/21 with [facility] for annual and 01/23/21 with [facility] for nexplanon replacement

## 2020-12-25 NOTE — Telephone Encounter (Signed)
Noted. Will order to arrive by apt date/time. 

## 2021-01-18 ENCOUNTER — Encounter: Payer: Self-pay | Admitting: Obstetrics & Gynecology

## 2021-01-18 ENCOUNTER — Other Ambulatory Visit (HOSPITAL_COMMUNITY)
Admission: RE | Admit: 2021-01-18 | Discharge: 2021-01-18 | Disposition: A | Discharge status: Home / Self Care | Payer: Medicaid Other | Source: Ambulatory Visit | Attending: Obstetrics & Gynecology | Admitting: Obstetrics & Gynecology

## 2021-01-18 ENCOUNTER — Other Ambulatory Visit: Payer: Self-pay

## 2021-01-18 ENCOUNTER — Telehealth: Payer: Self-pay | Admitting: Obstetrics & Gynecology

## 2021-01-18 ENCOUNTER — Ambulatory Visit (INDEPENDENT_AMBULATORY_CARE_PROVIDER_SITE_OTHER): Payer: Medicaid Other | Admitting: Obstetrics & Gynecology

## 2021-01-18 VITALS — BP 120/80 | Ht 60.0 in | Wt 233.0 lb

## 2021-01-18 DIAGNOSIS — Z124 Encounter for screening for malignant neoplasm of cervix: Secondary | ICD-10-CM | POA: Insufficient documentation

## 2021-01-18 DIAGNOSIS — Z113 Encounter for screening for infections with a predominantly sexual mode of transmission: Secondary | ICD-10-CM

## 2021-01-18 DIAGNOSIS — Z01419 Encounter for gynecological examination (general) (routine) without abnormal findings: Secondary | ICD-10-CM

## 2021-01-18 MED ORDER — PHENTERMINE HCL 37.5 MG PO TABS: ORAL_TABLET | ORAL | 0 refills | Status: DC

## 2021-01-18 NOTE — Progress Notes (Signed)
HPI:      [name] is a 25 y.o. [id number] who LMP was Patient's last menstrual period was 01/01/2021., she presents today for her annual examination. The patient has no complaints today. She is concerned about weight gain, about 30 lbs since having last pregnancy.    The patient is sexually active. Her last pap: approximate date 2019 and was normal. The patient does perform self breast exams.  There is no notable family history of breast or ovarian cancer in her family.  The patient has regular exercise: yes.  The patient denies current symptoms of depression.    GYN History: Contraception: Nexplanon . Occas bleeding. Does well, wants another.  PMHx: Past Medical History:  Diagnosis Date   Headache    Low-lying placenta 08/04/2017   Morbid obesity (HCC)    Pyelonephritis 07/2016   Past Surgical History:  Procedure Laterality Date   NO PAST SURGERIES     Family History  Problem Relation Age of Onset   Diabetes Maternal Uncle    Breast cancer Other    Healthy Mother    Healthy Father    Breast cancer Maternal Grandmother    Social History   Tobacco Use   Smoking status: Every Day    Packs/day: 0.25    Years: 5.00    Pack years: 1.25    Types: Cigarettes   Smokeless tobacco: Never  Vaping Use   Vaping Use: Never used  Substance Use Topics   Alcohol use: Yes    Comment: social   Drug use: No    Current Outpatient Medications:    etonogestrel (NEXPLANON) 68 MG IMPL implant, 1 each by Subdermal route once., Disp: , Rfl:    ibuprofen (ADVIL,MOTRIN) 600 MG tablet, Take 1 tablet (600 mg total) by mouth every 6 (six) hours as needed. (Patient not taking: Reported on 01/18/2021), Disp: 30 tablet, Rfl: 1   predniSONE (STERAPRED UNI-PAK 21 TAB) 10 MG (21) TBPK tablet, Take by mouth daily. Take 6 tabs by mouth daily  for 1 day, then 5 tabs for 1 day, then 4 tabs for 1 day, then 3 tabs for 1 day, 2 tabs for 1 day, then 1 tab by mouth daily for 1 day (Patient not taking:  Reported on 01/18/2021), Disp: 21 tablet, Rfl: 0   Prenatal Vit-Fe Fumarate-FA (PRENATAL MULTIVITAMIN) TABS tablet, Take 1 tablet by mouth daily at 12 noon. (Patient not taking: Reported on 01/18/2021), Disp: , Rfl:  Allergies: Patient has no known allergies.  Review of Systems  Constitutional:  Negative for chills, fever and malaise/fatigue.  HENT:  Negative for congestion, sinus pain and sore throat.   Eyes:  Negative for blurred vision and pain.  Respiratory:  Negative for cough and wheezing.   Cardiovascular:  Negative for chest pain and leg swelling.  Gastrointestinal:  Negative for abdominal pain, constipation, diarrhea, heartburn, nausea and vomiting.  Genitourinary:  Negative for dysuria, frequency, hematuria and urgency.  Musculoskeletal:  Negative for back pain, joint pain, myalgias and neck pain.  Skin:  Negative for itching and rash.  Neurological:  Negative for dizziness, tremors and weakness.  Endo/Heme/Allergies:  Does not bruise/bleed easily.  Psychiatric/Behavioral:  Negative for depression. The patient is not nervous/anxious and does not have insomnia.    Objective: BP 120/80   Ht 5' (1.524 m)   Wt 233 lb (105.7 kg)   LMP 01/01/2021   BMI 45.50 kg/m   Filed Weights   01/18/21 1525  Weight: 233 lb (105.7  kg)   Body mass index is 45.5 kg/m. Physical Exam Constitutional:      General: She is not in acute distress.    Appearance: She is well-developed. She is obese.  Genitourinary:     Bladder, rectum and urethral meatus normal.     No lesions in the vagina.     Right Labia: No rash, tenderness or lesions.    Left Labia: No tenderness, lesions or rash.    No vaginal bleeding.      Right Adnexa: not tender and no mass present.    Left Adnexa: not tender and no mass present.    No cervical motion tenderness, friability, lesion or polyp.     Uterus is not enlarged.     No uterine mass detected.    Pelvic exam was performed with patient in the lithotomy position.   Breasts:    Right: No mass, skin change or tenderness.     Left: No mass, skin change or tenderness.  HENT:     Head: Normocephalic and atraumatic. No laceration.     Right Ear: Hearing normal.     Left Ear: Hearing normal.     Mouth/Throat:     Pharynx: Uvula midline.  Eyes:     Pupils: Pupils are equal, round, and reactive to light.  Neck:     Thyroid: No thyromegaly.  Cardiovascular:     Rate and Rhythm: Normal rate and regular rhythm.     Heart sounds: No murmur heard.   No friction rub. No gallop.  Pulmonary:     Effort: Pulmonary effort is normal. No respiratory distress.     Breath sounds: Normal breath sounds. No wheezing.  Abdominal:     General: Bowel sounds are normal. There is no distension.     Palpations: Abdomen is soft.     Tenderness: There is no abdominal tenderness. There is no rebound.  Musculoskeletal:        General: Normal range of motion.     Cervical back: Normal range of motion and neck supple.  Neurological:     Mental Status: She is alert and oriented to person, place, and time.     Cranial Nerves: No cranial nerve deficit.  Skin:    General: Skin is warm and dry.  Psychiatric:        Judgment: Judgment normal.  Vitals reviewed.    Assessment:  ANNUAL EXAM 1. Women's annual routine gynecological examination   2. Screening for cervical cancer   3. Screen for STD (sexually transmitted disease)      Screening Plan:            1.  Cervical Screening-  Pap smear done today  2. Breast screening- Exam annually and mammogram>40 planned   3. Colonoscopy every 10 years, Hemoccult testing - after age 18  4. Labs managed by PCP  5. Counseling for contraception:  Nexplanon; plans exchange soon  The pregnancy intention screening data noted above was reviewed. Potential methods of contraception were discussed. The patient elected to proceed with Hormonal Implant.      F/U  Return in about 4 weeks (around 02/15/2021) for Nexplanon.  Annamarie Major, MD, [facility], [facility] 01/18/2021  3:41 PM

## 2021-01-18 NOTE — Patient Instructions (Signed)
Phentermine tablets or capsules What is this medication? PHENTERMINE (FEN ter meen) decreases your appetite. It is used with a reducedcalorie diet and exercise to help you lose weight. This medicine may be used for other purposes; ask your health care provider orpharmacist if you have questions. COMMON BRAND NAME(S): Adipex-P, Atti-Plex P, Atti-Plex P Spansule, Fastin,Lomaira, Pro-Fast, Tara-8 What should I tell my care team before I take this medication? They need to know if you have any of these conditions: agitation or nervousness diabetes glaucoma heart disease high blood pressure history of drug abuse or addiction history of stroke kidney disease lung disease called Primary Pulmonary Hypertension (PPH) taken an MAOI like Carbex, Eldepryl, Marplan, Nardil, or Parnate in last 14 days taking stimulant medicines for attention disorders, weight loss, or to stay awake thyroid disease an unusual or allergic reaction to phentermine, other medicines, foods, dyes, or preservatives pregnant or trying to get pregnant breast-feeding How should I use this medication? Take this medicine by mouth with a glass of water. Follow the directions on the prescription label. Take your medicine at regular intervals. Do not take itmore often than directed. Do not stop taking except on your doctor's advice. Talk to your pediatrician regarding the use of this medicine in children. While this drug may be prescribed for children 17 years or older for selectedconditions, precautions do apply. Overdosage: If you think you have taken too much of this medicine contact apoison control center or emergency room at once. NOTE: This medicine is only for you. Do not share this medicine with others. What if I miss a dose? If you miss a dose, take it as soon as you can. If it is almost time for yournext dose, take only that dose. Do not take double or extra doses. What may interact with this medication? Do not take this  medicine with any of the following medications: MAOIs like Carbex, Eldepryl, Marplan, Nardil, and Parnate This medicine may also interact with the following medications: alcohol certain medicines for depression, anxiety, or psychotic disorders certain medicines for high blood pressure linezolid medicines for colds or breathing difficulties like pseudoephedrine or phenylephrine medicines for diabetes sibutramine stimulant medicines for attention disorders, weight loss, or to stay awake This list may not describe all possible interactions. Give your health care provider a list of all the medicines, herbs, non-prescription drugs, or dietary supplements you use. Also tell them if you smoke, drink alcohol, or use illegaldrugs. Some items may interact with your medicine. What should I watch for while using this medication? Visit your doctor or health care provider for regular checks on your progress. Do not stop taking except on your health care provider's advice. You may develop a severe reaction. Your health care provider will tell you how muchmedicine to take. Do not take this medicine close to bedtime. It may prevent you from sleeping. You may get drowsy or dizzy. Do not drive, use machinery, or do anything that needs mental alertness until you know how this medicine affects you. Do not stand or sit up quickly, especially if you are an older patient. This reduces the risk of dizzy or fainting spells. Alcohol may increase dizziness anddrowsiness. Avoid alcoholic drinks. This medicine may affect blood sugar levels. Ask your healthcare provider ifchanges in diet or medicines are needed if you have diabetes. Women should inform their health care provider if they wish to become pregnant or think they might be pregnant. Losing weight while pregnant is not advised and may cause harm to the   unborn child. Talk to your health care provider formore information. What side effects may I notice from receiving  this medication? Side effects that you should report to your doctor or health care professionalas soon as possible: allergic reactions like skin rash, itching or hives, swelling of the face, lips, or tongue breathing problems changes in emotions or moods changes in vision chest pain or chest tightness fast, irregular heartbeat feeling faint or lightheaded increased blood pressure irritable restlessness tremors seizures signs and symptoms of a stroke like changes in vision; confusion; trouble speaking or understanding; severe headaches; sudden numbness or weakness of the face, arm or leg; trouble walking; dizziness; loss of balance or coordination unusually weak or tired Side effects that usually do not require medical attention (report to yourdoctor or health care professional if they continue or are bothersome): changes in taste constipation or diarrhea dizziness dry mouth headache trouble sleeping upset stomach This list may not describe all possible side effects. Call your doctor for medical advice about side effects. You may report side effects to FDA at1-800-FDA-1088. Where should I keep my medication? Keep out of the reach of children. This medicine can be abused. Keep your medicine in a safe place to protect it from theft. Do not share this medicine with anyone. Selling or giving away this medicine is dangerous and against thelaw. This medicine may cause harm and death if it is taken by other adults, children, or pets. Return medicine that has not been used to an official disposal site. Contact the DEA at 1-800-882-9539 or your city/county government to find a site. If you cannot return the medicine, mix any unused medicine with a substance like cat litter or coffee grounds. Then throw the medicine away in a sealed container like a sealed bag or coffee can with a lid. Do not use themedicine after the expiration date. Store at room temperature between 20 and 25 degrees C (68 and 77  degrees F).Keep container tightly closed. NOTE: This sheet is a summary. It may not cover all possible information. If you have questions about this medicine, talk to your doctor, pharmacist, orhealth care provider.  2022 Elsevier/Gold Standard (2019-04-23 12:54:20)  

## 2021-01-18 NOTE — Addendum Note (Signed)
Addended by: Nadara Mustard on: 01/18/2021 03:52 PM   Modules accepted: Orders

## 2021-01-22 LAB — GC/CHLAMYDIA PROBE AMP (~~LOC~~) NOT AT ARMC
Chlamydia: NEGATIVE
Comment: NEGATIVE
Comment: NORMAL
Neisseria Gonorrhea: NEGATIVE

## 2021-01-22 LAB — CYTOLOGY - PAP: Diagnosis: NEGATIVE

## 2021-01-23 ENCOUNTER — Ambulatory Visit: Payer: Self-pay | Admitting: Obstetrics & Gynecology

## 2021-01-29 NOTE — Telephone Encounter (Signed)
Per Epic. Apt r/s to 02/16/21

## 2021-02-16 ENCOUNTER — Ambulatory Visit (INDEPENDENT_AMBULATORY_CARE_PROVIDER_SITE_OTHER): Payer: Medicaid Other | Admitting: Obstetrics & Gynecology

## 2021-02-16 ENCOUNTER — Encounter: Payer: Self-pay | Admitting: Obstetrics & Gynecology

## 2021-02-16 ENCOUNTER — Ambulatory Visit: Payer: Medicaid Other | Admitting: Obstetrics & Gynecology

## 2021-02-16 ENCOUNTER — Other Ambulatory Visit: Payer: Self-pay

## 2021-02-16 VITALS — BP 120/80 | Ht 60.0 in | Wt 229.0 lb

## 2021-02-16 DIAGNOSIS — Z3046 Encounter for surveillance of implantable subdermal contraceptive: Secondary | ICD-10-CM | POA: Diagnosis not present

## 2021-02-16 MED ORDER — PHENTERMINE HCL 37.5 MG PO TABS: ORAL_TABLET | ORAL | 0 refills | Status: DC

## 2021-02-16 NOTE — Patient Instructions (Signed)
Nexplanon Instructions After Insertion  Keep bandage clean and dry for 24 hours  May use ice/Tylenol/Ibuprofen for soreness or pain  If you develop fever, drainage or increased warmth from incision site-contact office immediately   

## 2021-02-16 NOTE — Progress Notes (Signed)
  Nexplanon removal Procedure note - The Nexplanon was noted in the patient's arm and the end was identified. The skin was cleansed with a Betadine solution. A small injection of subcutaneous lidocaine with epinephrine was given over the end of the implant. An incision was made at the end of the implant. The rod was noted in the incision and grasped with a hemostat. It was noted to be intact.    Nexplanon Insertion  Patient given informed consent, signed copy in the chart, time out was performed.   Removal or prior Nexplanon as above. The ruler used to measure and mark insertion area.  New Nexplanon removed form packaging,  Device confirmed in needle, then inserted full length of needle and withdrawn per handbook instructions.  Pt insertion site covered with steri-strip and a bandage.   Minimal blood loss.  Pt tolerated the procedure well.   Annamarie Major, MD, [facility], [facility] 02/16/2021  9:41 AM

## 2021-02-23 ENCOUNTER — Ambulatory Visit: Payer: Medicaid Other

## 2021-02-28 ENCOUNTER — Ambulatory Visit: Payer: Self-pay | Admitting: Physician Assistant

## 2021-02-28 ENCOUNTER — Other Ambulatory Visit: Payer: Self-pay

## 2021-02-28 DIAGNOSIS — Z113 Encounter for screening for infections with a predominantly sexual mode of transmission: Secondary | ICD-10-CM

## 2021-02-28 LAB — WET PREP FOR TRICH, YEAST, CLUE
Trichomonas Exam: NEGATIVE
Yeast Exam: NEGATIVE

## 2021-03-01 ENCOUNTER — Encounter: Payer: Self-pay | Admitting: Physician Assistant

## 2021-03-01 NOTE — Progress Notes (Signed)
[facility] Department STI clinic/screening visit  Subjective:  MELANNI BENWAY is a 25 y.o. female being seen today for an STI screening visit. The patient reports they do not have symptoms.  Patient reports that they do not desire a pregnancy in the next year.   They reported they are not interested in discussing contraception today.  Patient's last menstrual period was 02/28/2021 (exact date).   Patient has the following medical conditions:   Patient Active Problem List   Diagnosis Date Noted   Postpartum care following vaginal delivery 12/11/2017    Chief Complaint  Patient presents with   SEXUALLY TRANSMITTED DISEASE    Screening    HPI  Patient reports that she is not having any symptoms but would like a screening today.  Reports that she had her Nexplanon removed and a new one inserted about 2 weeks ago and has had some spotting and cramping off and on since then.  Denies chronic conditions, surgeries and regular medicines.  LMP was 02/28/2021 and using Nexplanon as BCM.  States last HIV test was in July and last pap was7/2022.    See flowsheet for further details and programmatic requirements.    The following portions of the patient's history were reviewed and updated as appropriate: allergies, current medications, past medical history, past social history, past surgical history and problem list.  Objective:  There were no vitals filed for this visit.  Physical Exam Constitutional:      General: She is not in acute distress.    Appearance: Normal appearance.  HENT:     Head: Normocephalic and atraumatic.     Comments: No nits,lice, or hair loss. No cervical, supraclavicular or axillary adenopathy.     Mouth/Throat:     Mouth: Mucous membranes are moist.     Pharynx: Oropharynx is clear. No oropharyngeal exudate or posterior oropharyngeal erythema.  Eyes:     Conjunctiva/sclera: Conjunctivae normal.  Pulmonary:     Effort: Pulmonary effort is normal.   Abdominal:     Palpations: Abdomen is soft. There is no mass.     Tenderness: There is no abdominal tenderness. There is no guarding or rebound.  Genitourinary:    General: Normal vulva.     Rectum: Normal.     Comments: External genitalia/pubic area without nits, lice, edema, erythema, lesions and inguinal adenopathy. Vagina with normal mucosa and discharge. Cervix without visible lesions. Uterus firm, mobile, nt, no masses, no CMT, no adnexal tenderness or fullness.  Musculoskeletal:     Cervical back: Neck supple. No tenderness.  Skin:    General: Skin is warm and dry.     Findings: No bruising, erythema, lesion or rash.  Neurological:     Mental Status: She is alert and oriented to person, place, and time.  Psychiatric:        Mood and Affect: Mood normal.        Behavior: Behavior normal.        Thought Content: Thought content normal.        Judgment: Judgment normal.     Assessment and Plan:  VAUGHN FRIEZE is a 25 y.o. female presenting to the [facility] Department for STI screening  1. Screening for STD (sexually transmitted disease) Patient into clinic without symptoms. Reviewed with patient that wet mount is normal and no treatment is indicated today. Rec condoms with all sex. Await test results.  Counseled that RN will call if needs to RTC for treatment once results  are back.  - WET PREP FOR TRICH, YEAST, CLUE - Gonococcus culture - Chlamydia/Gonorrhea [place] [facility] - HIV [place] [facility] - Syphilis Serology, [place] [facility] - Gonococcus culture     No follow-ups on file.  Future Appointments  Date Time Provider Department Center  03/16/2021  1:50 PM Nadara Mustard, MD [facility] None    Matt Holmes, [place]

## 2021-03-05 LAB — GONOCOCCUS CULTURE

## 2021-03-09 ENCOUNTER — Telehealth: Payer: Self-pay | Admitting: Family Medicine

## 2021-03-09 ENCOUNTER — Telehealth: Payer: Self-pay

## 2021-03-09 NOTE — Telephone Encounter (Signed)
Pt calling; needs bcp called in to stop her bleeding; today is day 16; can't keep bleeding like this.  [phone] Pt states she is saturating a super tampon in 1 1/2 hours; does not feel weak or bad.  Pt aware PH is not in the office today and msg will go to SDJ who is on call.

## 2021-03-09 NOTE — Telephone Encounter (Signed)
Please call me in ref to birth control and pills.

## 2021-03-16 ENCOUNTER — Ambulatory Visit (INDEPENDENT_AMBULATORY_CARE_PROVIDER_SITE_OTHER): Payer: Self-pay | Admitting: Obstetrics & Gynecology

## 2021-03-16 ENCOUNTER — Other Ambulatory Visit: Payer: Self-pay

## 2021-03-16 ENCOUNTER — Encounter: Payer: Self-pay | Admitting: Obstetrics & Gynecology

## 2021-03-16 VITALS — BP 130/80 | Ht 64.0 in | Wt 219.0 lb

## 2021-03-16 DIAGNOSIS — E669 Obesity, unspecified: Secondary | ICD-10-CM

## 2021-03-16 MED ORDER — PHENTERMINE HCL 37.5 MG PO TABS: ORAL_TABLET | ORAL | 1 refills | Status: DC

## 2021-03-16 NOTE — Progress Notes (Signed)
  History of Present Illness:  Amy Ware is a 25 y.o. who was started on Phentermine approximately 4 weeks ago due to obesity/abnormal weight gain. The patient has lost 14 pounds over the past month due to meds..   She has these side effects: dry mouth.  She also reports more BTB than usual w recent Nexplanon change (has had Nexplanon every three yrs since 2015).    PMHx: She  has a past medical history of Headache, Low-lying placenta (08/04/2017), Morbid obesity (HCC), and Pyelonephritis (07/2016). Also,  has a past surgical history that includes No past surgeries., family history includes Breast cancer in her maternal grandmother and another family member; Diabetes in her maternal uncle; Healthy in her father and mother.,  reports that she has quit smoking. Her smoking use included cigarettes. She has a 1.25 pack-year smoking history. She has never used smokeless tobacco. She reports current alcohol use. She reports that she does not use drugs.  She has a current medication list which includes the following prescription(s): nexplanon and phentermine. Also, has No Known Allergies.  Review of Systems  All other systems reviewed and are negative.  Physical Exam:  BP 130/80   Ht 5\' 4"  (1.626 m)   Wt 219 lb (99.3 kg)   LMP 02/25/2021 (Exact Date)   BMI 37.59 kg/m  Body mass index is 37.59 kg/m. Filed Weights   03/16/21 1421  Weight: 219 lb (99.3 kg)    Physical Exam Constitutional:      General: She is not in acute distress.    Appearance: She is well-developed.  Musculoskeletal:        General: Normal range of motion.  Neurological:     Mental Status: She is alert and oriented to person, place, and time.  Skin:    General: Skin is warm and dry.  Vitals reviewed.    Assessment: obesity Medication treatment is going very well for her.  Plan: Patient is continued/added to prescription appetite suppressants: Phentermine .   Will continue to assist patient in incorporating  positive experiences into her life to promote a positive mental attitude.  Education given regarding appropriate lifestyle changes for weight loss, including regular physical activity, healthy coping strategies, caloric restriction, and healthy eating patterns.  The risks and benefits as well as side effects of medication, such as Phenteramine or Tenuate, is discussed.  The pros and cons of suppressing appetite and boosting metabolism is counseled.  Risks of tolerance and addiction discussed.  Use of medicine will be short term.  Pt to call with any negative side effects and agrees to keep follow up appointments.  ALSO, due to BTB on Nexplanon, will take one month pack of OCPS (sample gv).  A total of 21 minutes were spent face-to-face with the patient as well as preparation, review, communication, and documentation during this encounter.   03/18/21, MD, Annam[facility] Major Ob/Gyn, [facility] 03/16/2021  2:33 PM

## 2021-04-13 NOTE — Telephone Encounter (Signed)
Pt was seen 03/16/21 by PH.

## 2021-04-23 NOTE — Telephone Encounter (Signed)
Nexplanon rcvd/charged 02/16/21

## 2021-05-17 ENCOUNTER — Ambulatory Visit: Payer: Medicaid Other | Admitting: Obstetrics & Gynecology

## 2021-05-17 ENCOUNTER — Other Ambulatory Visit: Payer: Self-pay

## 2021-05-29 ENCOUNTER — Other Ambulatory Visit: Payer: Self-pay

## 2021-05-29 ENCOUNTER — Encounter: Payer: Self-pay | Admitting: Family Medicine

## 2021-05-29 ENCOUNTER — Ambulatory Visit: Payer: Self-pay | Admitting: Family Medicine

## 2021-05-29 DIAGNOSIS — Z113 Encounter for screening for infections with a predominantly sexual mode of transmission: Secondary | ICD-10-CM

## 2021-05-29 LAB — WET PREP FOR TRICH, YEAST, CLUE
Trichomonas Exam: NEGATIVE
Yeast Exam: NEGATIVE

## 2021-05-29 NOTE — Progress Notes (Signed)
[facility] Department STI clinic/screening visit  Subjective:  Amy Ware is a 25 y.o. female being seen today for an STI screening visit. The patient reports they do not have symptoms.  Patient reports that they do not desire a pregnancy in the next year.   They reported they are not interested in discussing contraception today.  Patient's last menstrual period was 05/07/2021 (exact date).   Patient has the following medical conditions:  There are no problems to display for this patient.   Chief Complaint  Patient presents with   SEXUALLY TRANSMITTED DISEASE    screening    HPI  Patient reports here for STI screening, partner was unfaithful.   Last HIV test per patient/review of record was 02/10/2021 Patient reports last pap was 01/18/2021.   See flowsheet for further details and programmatic requirements.    The following portions of the patient's history were reviewed and updated as appropriate: allergies, current medications, past medical history, past social history, past surgical history and problem list.  Objective:  There were no vitals filed for this visit.  Physical Exam Vitals and nursing note reviewed.  Constitutional:      Appearance: Normal appearance.  HENT:     Head: Normocephalic and atraumatic.     Mouth/Throat:     Mouth: Mucous membranes are moist.     Pharynx: Oropharynx is clear. No oropharyngeal exudate or posterior oropharyngeal erythema.  Pulmonary:     Effort: Pulmonary effort is normal.  Abdominal:     General: Abdomen is flat.     Palpations: There is no mass.     Tenderness: There is no abdominal tenderness. There is no rebound.  Genitourinary:    Exam position: Lithotomy position.     Pubic Area: No rash or pubic lice.      Labia:        Right: No rash or lesion.        Left: No rash or lesion.      Vagina: Normal. No vaginal discharge, erythema, bleeding or lesions.     Cervix: No cervical motion tenderness,  discharge, friability, lesion or erythema.     Uterus: Normal.      Adnexa: Right adnexa normal and left adnexa normal.     Comments: Deferred, pt self collected  Musculoskeletal:     Cervical back: Normal range of motion and neck supple.  Lymphadenopathy:     Head:     Right side of head: No preauricular or posterior auricular adenopathy.     Left side of head: No preauricular or posterior auricular adenopathy.     Cervical: No cervical adenopathy.     Upper Body:     Right upper body: No supraclavicular or axillary adenopathy.     Left upper body: No supraclavicular or axillary adenopathy.     Lower Body: No right inguinal adenopathy. No left inguinal adenopathy.  Skin:    General: Skin is warm and dry.     Findings: No rash.  Neurological:     Mental Status: She is alert and oriented to person, place, and time.  Psychiatric:        Behavior: Behavior normal.     Assessment and Plan:  Amy Ware is a 25 y.o. female presenting to the [facility] Department for STI screening  1. Screening examination for venereal disease Patient accepted all screenings including wet prep, oral, vaginal CT/GC and bloodwork for HIV/RPR.   Patient is currently using *Nexplanon to  prevent pregnancy. Patient accepted all screenings including wet prep, oral, vaginal CT/GC and bloodwork for HIV/RPR.  Patient meets criteria for HepB screening? No. Ordered? No - does not meet criteria  Patient meets criteria for HepC screening? No. Ordered? No - does not meet criteria   Wet prep results neg    No Treatment needed  Discussed time line for [facility] results and that patient will be called with positive results and encouraged patient to call if she had not heard in 2 weeks.  Counseled to return or seek care for continued or worsening symptoms Recommended condom use with all sex  Patient is currently using *Nexplanon to prevent pregnancy.   - Chlamydia/Gonorrhea [place] [facility] - WET PREP FOR  TRICH, YEAST, CLUE - Syphilis Serology, [place] [facility] - Gonococcus culture - HIV [place] [facility]   Return for as needed.  No future appointments.  Wendi Snipes, FNP

## 2021-05-29 NOTE — Progress Notes (Signed)
Pt here for STD screening.  Wet mount results reviewed, no treatment required.  Condoms given to pt. Berdie Ogren, RN

## 2021-06-02 LAB — GONOCOCCUS CULTURE

## 2021-06-18 ENCOUNTER — Other Ambulatory Visit: Payer: Self-pay

## 2021-06-18 ENCOUNTER — Emergency Department
Admission: EM | Admit: 2021-06-18 | Discharge: 2021-06-18 | Disposition: A | Discharge status: Home / Self Care | Payer: Medicaid Other | Attending: Emergency Medicine | Admitting: Emergency Medicine

## 2021-06-18 DIAGNOSIS — N760 Acute vaginitis: Secondary | ICD-10-CM | POA: Insufficient documentation

## 2021-06-18 DIAGNOSIS — R300 Dysuria: Secondary | ICD-10-CM

## 2021-06-18 DIAGNOSIS — N72 Inflammatory disease of cervix uteri: Secondary | ICD-10-CM | POA: Insufficient documentation

## 2021-06-18 DIAGNOSIS — Z87891 Personal history of nicotine dependence: Secondary | ICD-10-CM | POA: Insufficient documentation

## 2021-06-18 DIAGNOSIS — B9689 Other specified bacterial agents as the cause of diseases classified elsewhere: Secondary | ICD-10-CM | POA: Insufficient documentation

## 2021-06-18 DIAGNOSIS — B009 Herpesviral infection, unspecified: Secondary | ICD-10-CM

## 2021-06-18 LAB — CHLAMYDIA/NGC RT PCR (ARMC ONLY)
Chlamydia Tr: NOT DETECTED
N gonorrhoeae: NOT DETECTED

## 2021-06-18 LAB — URINALYSIS, COMPLETE (UACMP) WITH MICROSCOPIC
Bilirubin Urine: NEGATIVE
Glucose, UA: NEGATIVE mg/dL
Ketones, ur: 20 mg/dL — AB
Nitrite: NEGATIVE
Protein, ur: NEGATIVE mg/dL
Specific Gravity, Urine: 1.024 (ref 1.005–1.030)
pH: 5 (ref 5.0–8.0)

## 2021-06-18 LAB — WET PREP, GENITAL
Sperm: NONE SEEN
Trich, Wet Prep: NONE SEEN
WBC, Wet Prep HPF POC: >10 — AB (ref <10)
Yeast Wet Prep HPF POC: NONE SEEN

## 2021-06-18 LAB — HIV ANTIBODY (ROUTINE TESTING W REFLEX): HIV Screen 4th Generation wRfx: NONREACTIVE

## 2021-06-18 LAB — POC URINE PREG, ED: Preg Test, Ur: NEGATIVE

## 2021-06-18 MED ORDER — ACYCLOVIR 400 MG PO TABS: 400.0000 mg | ORAL_TABLET | Freq: Three times a day (TID) | ORAL | 0 refills | Status: AC

## 2021-06-18 MED ORDER — METRONIDAZOLE 500 MG PO TABS: 500.0000 mg | ORAL_TABLET | Freq: Two times a day (BID) | ORAL | 0 refills | Status: AC

## 2021-06-18 MED ORDER — CEFTRIAXONE SODIUM 1 G IJ SOLR
500.0000 mg | Freq: Once | INTRAMUSCULAR | Status: AC
  Administered 2021-06-18: 13:00:00 500 mg via INTRAMUSCULAR
  Filled 2021-06-18: qty 10

## 2021-06-18 MED ORDER — DOXYCYCLINE HYCLATE 100 MG PO CAPS: 100.0000 mg | ORAL_CAPSULE | Freq: Two times a day (BID) | ORAL | 0 refills | Status: AC

## 2021-06-18 NOTE — ED Provider Notes (Signed)
[facility] Emergency Department Provider Note  ____________________________________________   Event Date/Time   First MD Initiated Contact with Patient 06/18/21 1253     (approximate)  I have reviewed the triage vital signs and the nursing notes.   HISTORY  Chief Complaint Vaginal Itching (/)   HPI Amy Ware is a 25 y.o. female with past medical history of headaches, obesity and previous episode of pyelonephritis who presents for assessment of 2 days of burning with urination and some vaginal itching and pain.  Patient states she had some itching and whitish discharge about a week ago and took over-the-counter Monistat and this seemed to improve the itching and white discharge but then she developed the burning about 2 days ago.  She denies any bleeding, abdominal pain, back pain, fevers, headache, earache, sore throat, chest pain, cough, shortness of breath, rash or extremity pain.  She is currently sexually active and does not use barrier protection every time.  No other acute concerns at this time.         Past Medical History:  Diagnosis Date   Headache    Low-lying placenta 08/04/2017   Morbid obesity (HCC)    Pyelonephritis 07/2016    There are no problems to display for this patient.   Past Surgical History:  Procedure Laterality Date   NO PAST SURGERIES      Prior to Admission medications   Medication Sig Start Date End Date Taking? Authorizing Provider  acyclovir (ZOVIRAX) 400 MG tablet Take 1 tablet (400 mg total) by mouth 3 (three) times daily for 10 days. 06/18/21 06/28/21 Yes Gilles Chiquito, MD  doxycycline (VIBRAMYCIN) 100 MG capsule Take 1 capsule (100 mg total) by mouth 2 (two) times daily for 14 days. 06/18/21 07/02/21 Yes Gilles Chiquito, MD  metroNIDAZOLE (FLAGYL) 500 MG tablet Take 1 tablet (500 mg total) by mouth 2 (two) times daily for 7 days. 06/18/21 06/25/21 Yes Gilles Chiquito, MD  etonogestrel (NEXPLANON) 68 MG  IMPL implant 1 each by Subdermal route once.    [provider]  phentermine (ADIPEX-P) 37.5 MG tablet One tablet po in morning. 03/16/21   Nadara Mustard, MD    Allergies Patient has no known allergies.  Family History  Problem Relation Age of Onset   Diabetes Maternal Uncle    Breast cancer Other    Healthy Mother    Healthy Father    Breast cancer Maternal Grandmother     Social History Social History   Tobacco Use   Smoking status: Former    Packs/day: 0.25    Years: 5.00    Pack years: 1.25    Types: Cigarettes    Quit date: 05/27/2019    Years since quitting: 2.0   Smokeless tobacco: Never  Vaping Use   Vaping Use: Every day   Start date: 05/27/2019   Substances: Nicotine, Flavoring  Substance Use Topics   Alcohol use: Yes    Comment: social   Drug use: No    Review of Systems  Review of Systems  Constitutional:  Negative for chills and fever.  HENT:  Negative for sore throat.   Eyes:  Negative for pain.  Respiratory:  Negative for cough and stridor.   Cardiovascular:  Negative for chest pain.  Gastrointestinal:  Negative for vomiting.  Genitourinary:  Positive for dysuria.  Musculoskeletal:  Negative for myalgias.  Skin:  Negative for rash.  Neurological:  Negative for seizures, loss of consciousness and headaches.  Psychiatric/Behavioral:  Negative for suicidal ideas.   All other systems reviewed and are negative.    ____________________________________________   PHYSICAL EXAM:  VITAL SIGNS: ED Triage Vitals  Enc Vitals Group     BP 06/18/21 1202 124/73     Pulse Rate 06/18/21 1202 72     Resp 06/18/21 1202 18     Temp 06/18/21 1202 98 F (36.7 C)     Temp Source 06/18/21 1202 Oral     SpO2 06/18/21 1202 92 %     Weight 06/18/21 1138 218 lb 14.7 oz (99.3 kg)     Height 06/18/21 1138 5\' 4"  (1.626 m)     Head Circumference --      Peak Flow --      Pain Score --      Pain Loc --      Pain Edu? --      Excl. in GC? --     Vitals:   06/18/21 1202 06/18/21 1315  BP: 124/73 127/81  Pulse: 72 86  Resp: 18 17  Temp: 98 F (36.7 C)   SpO2: 92% 98%   Physical Exam Vitals and nursing note reviewed. Exam conducted with a chaperone present.  Constitutional:      General: She is not in acute distress.    Appearance: She is well-developed. She is obese.  HENT:     Head: Normocephalic and atraumatic.     Right Ear: External ear normal.     Left Ear: External ear normal.     Nose: Nose normal.  Eyes:     Conjunctiva/sclera: Conjunctivae normal.  Cardiovascular:     Rate and Rhythm: Normal rate and regular rhythm.     Heart sounds: No murmur heard. Pulmonary:     Effort: Pulmonary effort is normal. No respiratory distress.     Breath sounds: Normal breath sounds.  Abdominal:     Palpations: Abdomen is soft.     Tenderness: There is no abdominal tenderness.  Musculoskeletal:        General: No swelling.     Cervical back: Neck supple.  Skin:    General: Skin is warm and dry.     Capillary Refill: Capillary refill takes less than 2 seconds.  Neurological:     Mental Status: She is alert.  Psychiatric:        Mood and Affect: Mood normal.    On exam she does have some whitish discharge from her close cervical os appears a little bit erythematous and friable.  There is also some ulcerative lesions on the vulva and what appears to be 1 or 2 vesicular lesions. ____________________________________________   LABS (all labs ordered are listed, but only abnormal results are displayed)  Labs Reviewed  WET PREP, GENITAL - Abnormal; Notable for the following components:      Result Value   Clue Cells Wet Prep HPF POC PRESENT (*)    WBC, Wet Prep HPF POC >10 (*)    All other components within normal limits  URINALYSIS, COMPLETE (UACMP) WITH MICROSCOPIC - Abnormal; Notable for the following components:   Color, Urine YELLOW (*)    APPearance HAZY (*)    Hgb urine dipstick LARGE (*)    Ketones, ur 20 (*)     Leukocytes,Ua SMALL (*)    Bacteria, UA RARE (*)    All other components within normal limits  CHLAMYDIA/NGC RT PCR ([facility] ONLY)            URINE CULTURE  HIV ANTIBODY (ROUTINE TESTING W REFLEX)  RPR  POC URINE PREG, ED   ____________________________________________  EKG  ____________________________________________  RADIOLOGY  ED MD interpretation:    Official radiology report(s): No results found.  ____________________________________________   PROCEDURES  Procedure(s) performed (including Critical Care):  Procedures   ____________________________________________   INITIAL IMPRESSION / ASSESSMENT AND PLAN / ED COURSE      Patient presents with above-stated history exam for assessment of some ongoing burning with urination and vaginal itching additionally having some discharge that seem to improve with over-the-counter Monistat.  She denies any other symptoms at this time.  On arrival she is afebrile he medically stable.  On exam she does have some whitish discharge from her close cervical os appears a little bit erythematous and friable.  There is also some ulcerative lesions on the vulva and what appears to be 1 or 2 vesicular lesions.  Based on exam I am concerned for possible HSV infection in addition to cervicitis.  Pregnancy test is negative.  Wet prep was positive for clue cells but negative for yeast or trichomoniasis.  UA has some leukocyte esterase and rare bacteria with 11-20 WBCs.  There is still some hemoglobin noted.  Given concern for cervicitis on exam we will treat for gonorrhea chlamydia and send swab.  We will also treat for BV with a course of Flagyl in addition to doxycycline and acyclovir.  Discussed importance of informing partner having partner be tested as well as close outpatient PCP follow-up to follow-up for results of her HIV and syphilis testing.  Discussed safe sex and avoiding alcohol and sexual intercourse over the next couple weeks  while she is recovering from this especially avoiding alcohol while she is on the Flagyl.  Patient is amenable with plan.  Discharged stable condition.  Strict return precautions advised and discussed.      ____________________________________________   FINAL CLINICAL IMPRESSION(S) / ED DIAGNOSES  Final diagnoses:  Dysuria  Herpes simplex virus (HSV) infection  Cervicitis  Bacterial vaginosis    Medications  cefTRIAXone (ROCEPHIN) injection 500 mg (500 mg Intramuscular Given 06/18/21 1318)     ED Discharge Orders          Ordered    acyclovir (ZOVIRAX) 400 MG tablet  3 times daily        06/18/21 1311    metroNIDAZOLE (FLAGYL) 500 MG tablet  2 times daily        06/18/21 1342    doxycycline (VIBRAMYCIN) 100 MG capsule  2 times daily        06/18/21 1342             Note:  This document was prepared using Dragon voice recognition software and may include unintentional dictation errors.    Gilles Chiquito, MD 06/18/21 1344
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
## 2021-06-18 NOTE — Discharge Instructions (Addendum)
As we discussed today I will treat you for possible herpes infection as well as give antibiotics that will cover for possible gonorrhea and chlamydia and urinary tract infection.  We collected labs that we will test for HIV and syphilis although you need to follow-up the results of these labs with your PCP.  Please return for any new or worsening of symptoms.

## 2021-06-18 NOTE — ED Triage Notes (Signed)
Presents with some vaginal pain  and itching

## 2021-06-19 LAB — URINE CULTURE: Culture: <10000 — AB

## 2021-06-19 LAB — RPR: RPR Ser Ql: NONREACTIVE

## 2021-06-21 ENCOUNTER — Encounter: Payer: Self-pay | Admitting: Obstetrics and Gynecology

## 2021-06-21 ENCOUNTER — Ambulatory Visit (INDEPENDENT_AMBULATORY_CARE_PROVIDER_SITE_OTHER): Payer: Self-pay | Admitting: Obstetrics and Gynecology

## 2021-06-21 ENCOUNTER — Other Ambulatory Visit: Payer: Self-pay

## 2021-06-21 VITALS — BP 110/70 | Ht 60.0 in | Wt 205.0 lb

## 2021-06-21 DIAGNOSIS — N898 Other specified noninflammatory disorders of vagina: Secondary | ICD-10-CM

## 2021-06-21 DIAGNOSIS — Z113 Encounter for screening for infections with a predominantly sexual mode of transmission: Secondary | ICD-10-CM

## 2021-06-21 NOTE — Progress Notes (Signed)
[facility], [place]   Chief Complaint  Patient presents with   Follow-up    From ER, provider at ER advised pt to f/u with GYN to be tested for herpes    HPI:      Amy Ware is a 25 y.o. [id number] whose LMP was No LMP recorded. Patient has had an implant., presents today for f/u on vaginal pain from ED visit 06/18/21. Was having dysuria day after "rougher" sex and went to ED next day. No urin sx, no vag d/c, irritation, odor, no pelvic pain, fevers, flu-like sx. No pain/bleeding with sex. ED was concerned for HSV and pt started on acyclovir TID for 10 days and told to f/u. No culture done. Had BV on wet prep, started on flagyl. Neg STD testing, neg C&S at ED. Pt's sx resolved next day after ED visit.  She is sex active, no new partners. Has nexplanon. Neg pap 7/22  There are no problems to display for this patient.   Past Surgical History:  Procedure Laterality Date   NO PAST SURGERIES      Family History  Problem Relation Age of Onset   Healthy Mother    Healthy Father    Diabetes Maternal Uncle    Breast cancer Maternal Grandmother        early years    Social History   Socioeconomic History   Marital status: Single    Spouse name: Not on file   Number of children: Not on file   Years of education: Not on file   Highest education level: Not on file  Occupational History   Not on file  Tobacco Use   Smoking status: Former    Packs/day: 0.25    Years: 5.00    Pack years: 1.25    Types: Cigarettes    Quit date: 05/27/2019    Years since quitting: 2.0   Smokeless tobacco: Never  Vaping Use   Vaping Use: Every day   Start date: 05/27/2019   Substances: Nicotine, Flavoring  Substance and Sexual Activity   Alcohol use: Yes    Comment: social   Drug use: No   Sexual activity: Yes    Partners: Male    Birth control/protection: Implant, Condom  Other Topics Concern   Not on file  Social History Narrative   Not on file   Social Determinants  of Health   Financial Resource Strain: Not on file  Food Insecurity: Not on file  Transportation Needs: Not on file  Physical Activity: Not on file  Stress: Not on file  Social Connections: Not on file  Intimate Partner Violence: Not At Risk   Fear of Current or Ex-Partner: No   Emotionally Abused: No   Physically Abused: No   Sexually Abused: No    Outpatient Medications Prior to Visit  Medication Sig Dispense Refill   acyclovir (ZOVIRAX) 400 MG tablet Take 1 tablet (400 mg total) by mouth 3 (three) times daily for 10 days. 30 tablet 0   etonogestrel (NEXPLANON) 68 MG IMPL implant 1 each by Subdermal route once.     metroNIDAZOLE (FLAGYL) 500 MG tablet Take 1 tablet (500 mg total) by mouth 2 (two) times daily for 7 days. 14 tablet 0   phentermine (ADIPEX-P) 37.5 MG tablet One tablet po in morning. 30 tablet 1   doxycycline (VIBRAMYCIN) 100 MG capsule Take 1 capsule (100 mg total) by mouth 2 (two) times daily for 14 days. (Patient not taking:  Reported on 06/21/2021) 28 capsule 0   No facility-administered medications prior to visit.      ROS:  Review of Systems  Constitutional:  Negative for fever.  Gastrointestinal:  Negative for blood in stool, constipation, diarrhea, nausea and vomiting.  Genitourinary:  Positive for dysuria. Negative for dyspareunia, flank pain, frequency, hematuria, urgency, vaginal bleeding, vaginal discharge and vaginal pain.  Musculoskeletal:  Negative for back pain.  Skin:  Negative for rash.  BREAST: No symptoms   OBJECTIVE:   Vitals:  BP 110/70    Ht 5' (1.524 m)    Wt 205 lb (93 kg)    Breastfeeding No    BMI 40.04 kg/m   Physical Exam Vitals reviewed.  Constitutional:      Appearance: She is well-developed.  Pulmonary:     Effort: Pulmonary effort is normal.  Genitourinary:    General: Normal vulva.     Pubic Area: No rash.      Labia:        Right: No rash, tenderness or lesion.        Left: No rash, tenderness or lesion.       Comments: NO EVID OF ULCERATIVE LESIONS; NO ERYTHEMA Musculoskeletal:        General: Normal range of motion.     Cervical back: Normal range of motion.  Skin:    General: Skin is warm and dry.  Neurological:     General: No focal deficit present.     Mental Status: She is alert and oriented to person, place, and time.  Psychiatric:        Mood and Affect: Mood normal.        Behavior: Behavior normal.        Thought Content: Thought content normal.        Judgment: Judgment normal.    Assessment/Plan: Vaginal lesion - Plan: HSV 2 antibody, IgG; neg exam (lesions would still be present if HSV, even on acyclovir). Most likely small tear with sex. Check HSV 2 IgG in 4 wks. Discussed s/s HSV. F/u prn. Can d/c acyclovir  Screening for STD (sexually transmitted disease) - Plan: HSV 2 antibody, IgG     No follow-ups on file.  Oviya B. Jeana Kersting, PA-C 06/21/2021 12:34 PM

## 2021-06-27 ENCOUNTER — Ambulatory Visit: Payer: Medicaid Other

## 2021-07-26 ENCOUNTER — Other Ambulatory Visit: Payer: Medicaid Other

## 2021-09-10 ENCOUNTER — Ambulatory Visit: Payer: Self-pay | Admitting: Family Medicine

## 2021-09-10 ENCOUNTER — Encounter: Payer: Self-pay | Admitting: Family Medicine

## 2021-09-10 ENCOUNTER — Other Ambulatory Visit: Payer: Self-pay

## 2021-09-10 DIAGNOSIS — Z113 Encounter for screening for infections with a predominantly sexual mode of transmission: Secondary | ICD-10-CM

## 2021-09-10 LAB — WET PREP FOR TRICH, YEAST, CLUE
Trichomonas Exam: NEGATIVE
Yeast Exam: NEGATIVE

## 2021-09-10 LAB — HM HIV SCREENING LAB: HM HIV Screening: NEGATIVE

## 2021-09-10 NOTE — Progress Notes (Signed)
[facility] Department ? ?STI clinic/screening visit ?[place] [address] ?[phone] ? ?Subjective:  ?Amy Ware is a 26 y.o. female being seen today for an STI screening visit. The patient reports they do not have symptoms.  Patient reports that they do not desire a pregnancy in the next year.   They reported they are not interested in discussing contraception today.   ? ?Patient's last menstrual period was 09/08/2021 (approximate). ? ? ?Patient has the following medical conditions:  There are no problems to display for this patient. ? ? ?Chief Complaint  ?Patient presents with  ?? SEXUALLY TRANSMITTED DISEASE  ? ? ?HPI ? ?Patient reports she is here for an STD screen.    She denies symptoms. ? ?Last HIV test per patient/review of record was 06/21/2021 ?Patient reports last pap was 01/18/2021 ? ?Screening for MPX risk: ?Does the patient have an unexplained rash? No ?Is the patient MSM? No ?Does the patient endorse multiple sex partners or anonymous sex partners? No ?Did the patient have close or sexual contact with a person diagnosed with MPX? No ?Has the patient traveled outside the [place] where MPX is endemic? No ?Is there a high clinical suspicion for MPX-- evidenced by one of the following No ? -Unlikely to be chickenpox ? -Lymphadenopathy ? -Rash that present in same phase of evolution on any given body part ?See flowsheet for further details and programmatic requirements.  ? ? ?The following portions of the patient's history were reviewed and updated as appropriate: allergies, current medications, past medical history, past social history, past surgical history and problem list. ? ?Objective:  ?There were no vitals filed for this visit. ? ?Physical Exam ?Vitals and nursing note reviewed.  ?Constitutional:   ?   Appearance: Normal appearance.  ?HENT:  ?   Head: Normocephalic and atraumatic.  ?   Mouth/Throat:  ?   Mouth: Mucous membranes are moist.  ?   Pharynx:  Oropharynx is clear. No oropharyngeal exudate or posterior oropharyngeal erythema.  ?Pulmonary:  ?   Effort: Pulmonary effort is normal.  ?Abdominal:  ?   General: Abdomen is flat.  ?   Palpations: There is no mass.  ?   Tenderness: There is no abdominal tenderness. There is no rebound.  ?Genitourinary: ?   General: Normal vulva.  ?   Exam position: Lithotomy position.  ?   Pubic Area: No rash or pubic lice.   ?   Labia:     ?   Right: No rash or lesion.     ?   Left: No rash or lesion.   ?   Vagina: Normal. No vaginal discharge, erythema, bleeding or lesions.  ?   Cervix: No discharge, friability, lesion or erythema.  ?   Comments: Vagina- pH> 4.5, small amount white disch  ?Bimanual not indicated ? ?Lymphadenopathy:  ?   Head:  ?   Right side of head: No preauricular or posterior auricular adenopathy.  ?   Left side of head: No preauricular or posterior auricular adenopathy.  ?   Cervical: No cervical adenopathy.  ?   Upper Body:  ?   Right upper body: No supraclavicular or axillary adenopathy.  ?   Left upper body: No supraclavicular or axillary adenopathy.  ?   Lower Body: No right inguinal adenopathy. No left inguinal adenopathy.  ?Skin: ?   General: Skin is warm and dry.  ?   Findings: No rash.  ?Neurological:  ?  Mental Status: She is alert and oriented to person, place, and time.  ? ? ? ?Assessment and Plan:  ?Amy Ware is a 26 y.o. female presenting to the [facility] Department for STI screening ? ?1. Screening examination for venereal disease ? ?- WET PREP FOR TRICH, YEAST, CLUE ?- Gonococcus culture ?- HIV [place] [facility] ?- Syphilis Serology, [place] [facility] ?- Gonococcus culture ? ?Co. to use condoms for STD prevention. ?Client will be notified of positive lab results. ? ?Return if symptoms worsen or fail to improve. ? ?No future appointments. ? ?Hassell Done, FNP ?

## 2021-09-10 NOTE — Progress Notes (Signed)
Patient here for STD testing. Wet prep reviewed. Providers orders completed. Condoms given.  ?

## 2021-09-15 LAB — GONOCOCCUS CULTURE

## 2022-08-20 ENCOUNTER — Encounter: Payer: Self-pay | Admitting: Advanced Practice Midwife

## 2022-08-20 ENCOUNTER — Other Ambulatory Visit (HOSPITAL_COMMUNITY)
Admission: RE | Admit: 2022-08-20 | Discharge: 2022-08-20 | Disposition: A | Discharge status: Home / Self Care | Payer: Medicaid Other | Source: Ambulatory Visit | Attending: Advanced Practice Midwife | Admitting: Advanced Practice Midwife

## 2022-08-20 ENCOUNTER — Ambulatory Visit (INDEPENDENT_AMBULATORY_CARE_PROVIDER_SITE_OTHER): Payer: Medicaid Other | Admitting: Advanced Practice Midwife

## 2022-08-20 VITALS — BP 132/89 | HR 79 | Ht 60.0 in | Wt 223.5 lb

## 2022-08-20 DIAGNOSIS — Z124 Encounter for screening for malignant neoplasm of cervix: Secondary | ICD-10-CM | POA: Diagnosis present

## 2022-08-20 DIAGNOSIS — Z1159 Encounter for screening for other viral diseases: Secondary | ICD-10-CM

## 2022-08-20 DIAGNOSIS — Z8742 Personal history of other diseases of the female genital tract: Secondary | ICD-10-CM

## 2022-08-20 DIAGNOSIS — Z113 Encounter for screening for infections with a predominantly sexual mode of transmission: Secondary | ICD-10-CM | POA: Insufficient documentation

## 2022-08-20 DIAGNOSIS — Z01419 Encounter for gynecological examination (general) (routine) without abnormal findings: Secondary | ICD-10-CM | POA: Diagnosis present

## 2022-08-21 ENCOUNTER — Encounter: Payer: Self-pay | Admitting: Advanced Practice Midwife

## 2022-08-21 LAB — HSV 1 AND 2 AB, IGG
HSV 1 Glycoprotein G Ab, IgG: 46.9 index — ABNORMAL HIGH (ref 0.00–0.90)
HSV 2 IgG, Type Spec: <0.91 index (ref 0.00–0.90)

## 2022-08-21 LAB — HIV ANTIBODY (ROUTINE TESTING W REFLEX): HIV Screen 4th Generation wRfx: NONREACTIVE

## 2022-08-21 LAB — HEPATITIS B SURFACE ANTIBODY,QUALITATIVE: Hep B Surface Ab, Qual: NONREACTIVE

## 2022-08-21 LAB — RPR QUALITATIVE: RPR Ser Ql: NONREACTIVE

## 2022-08-21 LAB — HEPATITIS C ANTIBODY: Hep C Virus Ab: NONREACTIVE

## 2022-08-21 NOTE — Progress Notes (Signed)
[facility]  Date of Service: 08/20/2022  Gynecology Annual Exam  PCP: [facility], [place]  Chief Complaint:  Chief Complaint  Patient presents with   Annual Exam   std check    History of Present Illness: Patient is a 27 y.o. [id number] presents for annual exam. The patient has no gyn complaints today. She has been told in the past that she may have HSV, however, she has never had HSV testing (see 06/21/21 note). She requests testing today. She denies ever having an outbreak of blisters or any current symptoms. She request vaginitis screening today due to history of vaginitis.  LMP: Patient's last menstrual period was 08/01/2022 (exact date). Average Interval: irregular, every 4-6 months Duration of flow: spotting to moderate a few days to 2 weeks Heavy Menses: no Clots: no Intermenstrual Bleeding: no Postcoital Bleeding: no Dysmenorrhea: no  The patient is sexually active. She currently uses Nexplanon for contraception. She denies dyspareunia.  The patient does perform self breast exams.  There is no notable family history of breast or ovarian cancer in her family. Her maternal great grandmother had breast cancer at unknown age.  The patient wears seatbelts: yes.  The patient has regular exercise:  she reports 15 minutes daily, she admits healthy diet, adequate hydration and sleep .    The patient denies current symptoms of depression.    Review of Systems: Review of Systems  Constitutional:  Negative for chills and fever.  HENT:  Negative for congestion, ear discharge, ear pain, hearing loss, sinus pain and sore throat.   Eyes:  Negative for blurred vision and double vision.  Respiratory:  Negative for cough, shortness of breath and wheezing.   Cardiovascular:  Negative for chest pain, palpitations and leg swelling.  Gastrointestinal:  Negative for abdominal pain, blood in stool, constipation, diarrhea, heartburn, melena, nausea and vomiting.  Genitourinary:  Negative  for dysuria, flank pain, frequency, hematuria and urgency.  Musculoskeletal:  Negative for back pain, joint pain and myalgias.  Skin:  Negative for itching and rash.  Neurological:  Negative for dizziness, tingling, tremors, sensory change, speech change, focal weakness, seizures, loss of consciousness, weakness and headaches.  Endo/Heme/Allergies:  Negative for environmental allergies. Does not bruise/bleed easily.  Psychiatric/Behavioral:  Negative for depression, hallucinations, memory loss, substance abuse and suicidal ideas. The patient is not nervous/anxious and does not have insomnia.     Past Medical History:  There are no problems to display for this patient.   Past Surgical History:  Past Surgical History:  Procedure Laterality Date   NO PAST SURGERIES      Gynecologic History:  Patient's last menstrual period was 08/01/2022 (exact date). Contraception: Nexplanon Last Pap: 2022 Results were:  no abnormalities   Obstetric History: [id number]  Family History:  Family History  Problem Relation Age of Onset   Healthy Mother    Healthy Father    Diabetes Maternal Uncle    Breast cancer Maternal Grandmother        early years    Social History:  Social History   Socioeconomic History   Marital status: Single    Spouse name: Not on file   Number of children: Not on file   Years of education: Not on file   Highest education level: Not on file  Occupational History   Not on file  Tobacco Use   Smoking status: Every Day    Types: E-cigarettes   Smokeless tobacco: Never  Vaping Use   Vaping Use: Every  day   Start date: 05/27/2019   Substances: Nicotine, Flavoring  Substance and Sexual Activity   Alcohol use: Yes    Comment: social   Drug use: No   Sexual activity: Yes    Partners: Male    Birth control/protection: Implant, Condom  Other Topics Concern   Not on file  Social History Narrative   Not on file   Social Determinants of Health   Financial  Resource Strain: Not on file  Food Insecurity: Not on file  Transportation Needs: Not on file  Physical Activity: Not on file  Stress: Not on file  Social Connections: Not on file  Intimate Partner Violence: Not At Risk (09/10/2021)   Humiliation, Afraid, Rape, and Kick questionnaire    Fear of Current or Ex-Partner: No    Emotionally Abused: No    Physically Abused: No    Sexually Abused: No    Allergies:  No Known Allergies  Medications: Prior to Admission medications   Medication Sig Start Date End Date Taking? Authorizing Provider  etonogestrel (NEXPLANON) 68 MG IMPL implant 1 each by Subdermal route once.    [provider]  phentermine (ADIPEX-P) 37.5 MG tablet One tablet po in morning. 03/16/21   Gae Dry, MD    Physical Exam Vitals: Blood pressure 132/89, pulse 79, height 5' (1.524 m), weight 223 lb 8 oz (101.4 kg), last menstrual period 08/01/2022.  General: NAD HEENT: normocephalic, anicteric Thyroid: no enlargement, no palpable nodules Pulmonary: No increased work of breathing, CTAB Cardiovascular: RRR, distal pulses 2+ Breast: Breast symmetrical, no tenderness, no palpable nodules or masses, no skin or nipple retraction present, no nipple discharge.  No axillary or supraclavicular lymphadenopathy. Abdomen: NABS, soft, non-tender, non-distended.  Umbilicus without lesions.  No hepatomegaly, splenomegaly or masses palpable. No evidence of hernia  Genitourinary:  External: Normal external female genitalia.  Normal urethral meatus, normal Bartholin's and Skene's glands.    Vagina: Normal vaginal mucosa, no evidence of prolapse.    Cervix: Grossly normal in appearance, no bleeding  Uterus: Non-enlarged, mobile, normal contour.  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 [id number] routine annual exam  Plan: Problem List Items Addressed This Visit   None Visit Diagnoses     Well woman exam with routine gynecological exam    -  Primary   Relevant Orders   HSV(herpes simplex vrs) 1+2 ab-IgG   RPR Qual (Completed)   Hepatitis B surface antibody,qualitative (Completed)   Hepatitis C antibody (Completed)   HIV Antibody (routine testing w rflx) (Completed)   Cytology - PAP   Cervicovaginal ancillary only   Screen for sexually transmitted diseases       Relevant Orders   HSV(herpes simplex vrs) 1+2 ab-IgG   RPR Qual (Completed)   Hepatitis B surface antibody,qualitative (Completed)   Hepatitis C antibody (Completed)   Cytology - PAP   Need for hepatitis C screening test       Relevant Orders   Hepatitis C antibody (Completed)   Need for hepatitis B screening test       Relevant Orders  Hepatitis B surface antibody,qualitative (Completed)   History of vaginitis       Relevant Orders   Cervicovaginal ancillary only   Screening for cervical cancer       Relevant Orders   Cytology - PAP       1) 4) Gardasil Series discussed and if applicable offered to patient - Patient has not previously completed 3 shot series   2) STI screening  was offered and accepted  3)  ASCCP guidelines and rationale discussed.  Patient opts for every 3 years screening interval  4) Contraception - the patient is currently using  Nexplanon.  She is happy with her current form of contraception and plans to continue We discussed safe sex practices to reduce her furture risk of STI's.    5) Return in about 1 year (around 08/21/2023) for annual established gyn.   Rod Can, [facility] 08/21/2022, 11:12 AM

## 2022-08-21 NOTE — Patient Instructions (Addendum)
Preventing Sexually Transmitted Infections, Adult Sexually transmitted infections (STIs) are spread from person to person (are contagious). They are spread, or transmitted, during sex. The sex may be vaginal, anal, or oral. STIs can be passed during sexual contact with skin, genitals, mouth, or rectum. They may spread through body fluids, such as saliva, semen, blood, vaginal mucus, and urine. STIs are very common. They can happen in people of all ages. Some common STIs are: Herpes. Hepatitis B. Chlamydia. Gonorrhea. Syphilis. Trichomoniasis. Human papillomavirus (HPV). Human immunodeficiency virus (HIV). This can cause acquired immunodeficiency syndrome (AIDS). How can STIs affect me? You may not have symptoms with an STI. Even if you do not have symptoms, you can still spread the infection to others. You also still need treatment. STIs can be treated. Some STIs can be cured. Other STIs cannot be cured and will affect you for the rest of your life. Certain STIs may: Require you to take medicine for the rest of your life. Affect your ability to have children. Increase your risk for getting other STIs. Increase your risk of getting certain conditions. These may include: Cervical cancer. Pelvic inflammatory disease (PID). Organ damage or damage to other parts of your body. This can happen if the infection spreads. Cause problems during pregnancy. STIs may be spread to the baby during pregnancy or birth. Females tend to have more severe problems from STIs than males. What can increase my risk? You may be more at risk for an STI if: You do not use protection during sex. You have more than one sex partner. You have a sex partner who has other sex partners. You have sex with a person who has an STI. You have an STI, or you have had an STI before. You inject drugs or have a sex partner who injects drugs. What actions can I take to prevent STIs? The only way to fully prevent STIs is not to  have sex of any kind. This is called practicing abstinence. If you are sexually active, you can protect yourself and others by taking these actions to lower your risk of getting an STI: Lifestyle Have only one sex partner or limit the number of sex partners you have. Avoid having sex after you have alcohol or drugs. Alcohol and drugs can affect your ability to make good choices. This can lead to risky sexual behaviors. Go to prevention counseling. This can teach you how to avoid getting an STI. Barrier protection  Use methods to stop body fluids from being exchanged between partners during sex (barrier protection). These methods can be used during oral, vaginal, or anal sex. They include: External condom, for males. Internal condom, for females. Dental dam. Use a new barrier method for every sex act from start to finish. Know that a barrier method may not protect you from all STIs. Some STIs, such as herpes, are spread through skin-to-skin contact. Avoid all sexual contact if you or a partner has herpes and there is an active flare with open sores. Birth control pills, injections, implants, and intrauterine devices (IUDs) do not protect against STIs. To prevent both STIs and pregnancy, always use a condom with a second form of birth control. General information Ask your health care provider about taking pre-exposure prophylaxis (PrEP) to prevent HIV. Stay up to date on your vaccines. Some vaccines can lower your risk of getting certain STIs. These include: Hepatitis B vaccine. HPV vaccine. This is recommended for people up to age 51. Get tested for STIs. Have your  partners get tested, too. If you test positive for an STI, follow recommendations from your health care provider about treatment. Make sure your sex partners are tested and treated as well. Where to find more information Learn more about STIs from: Centers for Disease Control and Prevention (CDC): More information about certain  STIs: StoreMirror.com.cy Places to get sexual health counseling and treatment for free or at a low cost: gettested.StoreMirror.com.cy U.S. Department of Health and Human Services [facility]): LegalWarrants.gl This information is not intended to replace advice given to you by your health care provider. Make sure you discuss any questions you have with your health care provider. Document Revised: 01/29/2022 Document Reviewed: 11/30/2021 Elsevier Patient Education  [place].

## 2022-08-22 ENCOUNTER — Telehealth: Payer: Self-pay

## 2022-08-22 LAB — CERVICOVAGINAL ANCILLARY ONLY
Bacterial Vaginitis (gardnerella): NEGATIVE
Candida Glabrata: NEGATIVE
Candida Vaginitis: NEGATIVE
Comment: NEGATIVE
Comment: NEGATIVE
Comment: NEGATIVE

## 2022-08-22 LAB — CYTOLOGY - PAP
Chlamydia: NEGATIVE
Comment: NEGATIVE
Comment: NEGATIVE
Comment: NORMAL
Diagnosis: NEGATIVE
Neisseria Gonorrhea: NEGATIVE
Trichomonas: POSITIVE — AB

## 2022-08-22 NOTE — Telephone Encounter (Signed)
Patient called. She wanted to speak with the Provider that she saw the other day in regards to her lab results. She was very vague and did not want to speak with anyone else but you. She wants you to contact her at the number on file.

## 2022-08-28 ENCOUNTER — Other Ambulatory Visit: Payer: Self-pay | Admitting: Advanced Practice Midwife

## 2022-08-28 DIAGNOSIS — A599 Trichomoniasis, unspecified: Secondary | ICD-10-CM

## 2022-08-28 MED ORDER — METRONIDAZOLE 500 MG PO TABS: 2000.0000 mg | ORAL_TABLET | Freq: Once | ORAL | 0 refills | Status: AC

## 2022-08-28 NOTE — Progress Notes (Signed)
Rx metronidazole sent to treat trich infection. Message to patient.

## 2022-10-10 ENCOUNTER — Ambulatory Visit
Admission: RE | Admit: 2022-10-10 | Discharge: 2022-10-10 | Disposition: A | Discharge status: Home / Self Care | Payer: Medicaid Other | Source: Ambulatory Visit | Attending: Emergency Medicine | Admitting: Emergency Medicine

## 2022-10-10 VITALS — BP 113/78 | HR 80 | Temp 98.3°F | Resp 18

## 2022-10-10 DIAGNOSIS — J302 Other seasonal allergic rhinitis: Secondary | ICD-10-CM

## 2022-10-10 DIAGNOSIS — J029 Acute pharyngitis, unspecified: Secondary | ICD-10-CM | POA: Diagnosis not present

## 2022-10-10 LAB — POCT RAPID STREP A (OFFICE): Rapid Strep A Screen: NEGATIVE

## 2022-10-10 NOTE — ED Triage Notes (Signed)
Pt reports sore throat and hoarseness starting around 0200.  Also reports stuffy nose.  No fevers.  Baby's father had runny nose last week.

## 2022-10-10 NOTE — ED Provider Notes (Signed)
[name]    CSN: [id number] Arrival date & time: 10/10/22  1800      History   Chief Complaint Chief Complaint  Patient presents with   Sore Throat    HPI Amy Ware is a 27 y.o. female.  Patient presents with sore throat, hoarse voice, runny nose, congestion, cough x 1 day.  No OTC medications taken today.  She denies fever, chills, rash, ear pain, shortness of breath, or other symptoms.  Her medical history includes current smoker/vape, morbid obesity.    The history is provided by the patient and medical records.    Past Medical History:  Diagnosis Date   Headache    Low-lying placenta 08/04/2017   Morbid obesity    Pyelonephritis 07/2016    There are no problems to display for this patient.   Past Surgical History:  Procedure Laterality Date   NO PAST SURGERIES      OB History     Gravida  2   Para  2   Term  2   Preterm      AB      Living  2      SAB      IAB      Ectopic      Multiple      Live Births  1            Home Medications    Prior to Admission medications   Medication Sig Start Date End Date Taking? Authorizing Provider  etonogestrel (NEXPLANON) 68 MG IMPL implant 1 each by Subdermal route once.   Yes [provider]  phentermine (ADIPEX-P) 37.5 MG tablet One tablet po in morning. 03/16/21   Nadara Mustard, MD    Family History Family History  Problem Relation Age of Onset   Healthy Mother    Healthy Father    Diabetes Maternal Uncle    Breast cancer Maternal Grandmother        early years    Social History Social History   Tobacco Use   Smoking status: Every Day    Types: E-cigarettes   Smokeless tobacco: Never  Vaping Use   Vaping Use: Every day   Start date: 05/27/2019   Substances: Nicotine, Flavoring  Substance Use Topics   Alcohol use: Yes    Comment: social   Drug use: No     Allergies   Patient has no known allergies.   Review of Systems Review of Systems   Constitutional:  Negative for chills and fever.  HENT:  Positive for congestion, rhinorrhea and sore throat. Negative for ear pain.   Respiratory:  Positive for cough. Negative for shortness of breath.   Cardiovascular:  Negative for chest pain and palpitations.  Gastrointestinal:  Negative for abdominal pain, diarrhea and vomiting.  Skin:  Negative for rash.  All other systems reviewed and are negative.    Physical Exam Triage Vital Signs ED Triage Vitals  Enc Vitals Group     BP 10/10/22 1827 113/78     Pulse Rate 10/10/22 1819 80     Resp 10/10/22 1819 18     Temp 10/10/22 1819 98.3 F (36.8 C)     Temp src --      SpO2 10/10/22 1819 95 %     Weight --      Height --      Head Circumference --      Peak Flow --      Pain  Score 10/10/22 1832 7     Pain Loc --      Pain Edu? --      Excl. in GC? --    No data found.  Updated Vital Signs BP 113/78   Pulse 80   Temp 98.3 F (36.8 C)   Resp 18   SpO2 95%   Visual Acuity Right Eye Distance:   Left Eye Distance:   Bilateral Distance:    Right Eye Near:   Left Eye Near:    Bilateral Near:     Physical Exam Vitals and nursing note reviewed.  Constitutional:      General: She is not in acute distress.    Appearance: She is well-developed. She is obese. She is not ill-appearing.  HENT:     Right Ear: Tympanic membrane normal.     Left Ear: Tympanic membrane normal.     Nose: Rhinorrhea present.     Mouth/Throat:     Mouth: Mucous membranes are moist.     Pharynx: Posterior oropharyngeal erythema present.  Cardiovascular:     Rate and Rhythm: Normal rate and regular rhythm.     Heart sounds: Normal heart sounds.  Pulmonary:     Effort: Pulmonary effort is normal. No respiratory distress.     Breath sounds: Normal breath sounds.  Musculoskeletal:     Cervical back: Neck supple.  Skin:    General: Skin is warm and dry.  Neurological:     Mental Status: She is alert.  Psychiatric:        Mood and  Affect: Mood normal.        Behavior: Behavior normal.      UC Treatments / Results  Labs (all labs ordered are listed, but only abnormal results are displayed) Labs Reviewed  POCT RAPID STREP A (OFFICE)    EKG   Radiology No results found.  Procedures Procedures (including critical care time)  Medications Ordered in UC Medications - No data to display  Initial Impression / Assessment and Plan / UC Course  I have reviewed the triage vital signs and the nursing notes.  Pertinent labs & imaging results that were available during my care of the patient were reviewed by me and considered in my medical decision making (see chart for details).    Viral pharyngitis, seasonal allergies.  Rapid strep negative.  Discussed symptomatic treatment including Tylenol or ibuprofen, Mucinex, Flonase, Zyrtec.  Instructed patient to follow up with her PCP if her symptoms are not improving.  Education provided on pharyngitis and allergic rhinitis.  She agrees to plan of care.    Final Clinical Impressions(s) / UC Diagnoses   Final diagnoses:  Viral pharyngitis  Seasonal allergies     Discharge Instructions      Your strep test is negative.    Take Tylenol or ibuprofen as needed for fever or discomfort.  Take Mucinex as needed for congestion.  Use Flonase nasal spray and take Zyrtec as needed for allergy symptoms.  Follow-up with your primary care provider if your symptoms are not improving.         ED Prescriptions   None    PDMP not reviewed this encounter.   Mickie Bail, NP 10/10/22 [phone]

## 2022-10-10 NOTE — Discharge Instructions (Addendum)
Your strep test is negative.    Take Tylenol or ibuprofen as needed for fever or discomfort.  Take Mucinex as needed for congestion.  Use Flonase nasal spray and take Zyrtec as needed for allergy symptoms.  Follow-up with your primary care provider if your symptoms are not improving.

## 2022-11-19 ENCOUNTER — Ambulatory Visit: Payer: Medicaid Other | Admitting: Family Medicine

## 2022-11-19 ENCOUNTER — Encounter: Payer: Self-pay | Admitting: Family Medicine

## 2022-11-19 ENCOUNTER — Telehealth: Payer: Self-pay

## 2022-11-19 DIAGNOSIS — B9689 Other specified bacterial agents as the cause of diseases classified elsewhere: Secondary | ICD-10-CM

## 2022-11-19 DIAGNOSIS — Z113 Encounter for screening for infections with a predominantly sexual mode of transmission: Secondary | ICD-10-CM | POA: Diagnosis not present

## 2022-11-19 LAB — WET PREP FOR TRICH, YEAST, CLUE
Trichomonas Exam: NEGATIVE
Yeast Exam: NEGATIVE

## 2022-11-19 LAB — HM HIV SCREENING LAB: HM HIV Screening: NEGATIVE

## 2022-11-19 MED ORDER — METRONIDAZOLE 500 MG PO TABS: 500.0000 mg | ORAL_TABLET | Freq: Two times a day (BID) | ORAL | 0 refills | Status: AC

## 2022-11-19 NOTE — Progress Notes (Signed)
The patient was dispensed Metronidazole 500mg  lot #1309 exp 09/29/23 14 tablets, 2 tablets per day x7 days today. I provided counseling today regarding the medication. We discussed the medication, the side effects and when to call clinic. Patient given the opportunity to ask questions. Questions answered.

## 2022-11-19 NOTE — Progress Notes (Signed)
[facility] Department  STI clinic/screening visit [address] [place] [address] [phone]  Subjective:  Amy Ware is a 27 y.o. female being seen today for an STI screening visit. The patient reports they do have symptoms.  Patient reports that they do not desire a pregnancy in the next year.   They reported they are not interested in discussing contraception today.    No LMP recorded. Patient has had an implant.  Patient has the following medical conditions:  There are no problems to display for this patient.   Chief Complaint  Patient presents with   SEXUALLY TRANSMITTED DISEASE    Contact to [place].  "Thick white discharge and foul smell x 1 1/2 weeks"    HPI  Patient reports   Does the patient using douching products? Yes  Last HIV test per patient/review of record was  Lab Results  Component Value Date   HMHIVSCREEN Negative - Validated 09/10/2021    Lab Results  Component Value Date   HIV Non Reactive 08/20/2022   Patient reports last pap was  Lab Results  Component Value Date   DIAGPAP  08/20/2022    - Negative for intraepithelial lesion or malignancy (NILM)   No results found for: "SPECADGYN"  Screening for MPX risk: Does the patient have an unexplained rash? No Is the patient MSM? No Does the patient endorse multiple sex partners or anonymous sex partners? No Did the patient have close or sexual contact with a person diagnosed with MPX? No Has the patient traveled outside the [place] where MPX is endemic? No Is there a high clinical suspicion for MPX-- evidenced by one of the following No  -Unlikely to be chickenpox  -Lymphadenopathy  -Rash that present in same phase of evolution on any given body part See flowsheet for further details and programmatic requirements.   Immunization history:  Immunization History  Administered Date(s) Administered   Influenza,inj,Quad PF,6+ Mos 07/06/2016   Tdap 10/13/2017     The  following portions of the patient's history were reviewed and updated as appropriate: allergies, current medications, past medical history, past social history, past surgical history and problem list.  Objective:  There were no vitals filed for this visit.  Physical Exam Vitals and nursing note reviewed.  Constitutional:      Appearance: Normal appearance.  HENT:     Head: Normocephalic and atraumatic.     Mouth/Throat:     Mouth: Mucous membranes are moist.     Pharynx: Oropharynx is clear. No oropharyngeal exudate or posterior oropharyngeal erythema.  Pulmonary:     Effort: Pulmonary effort is normal.  Abdominal:     General: Abdomen is flat.     Palpations: There is no mass.     Tenderness: There is no abdominal tenderness. There is no rebound.  Genitourinary:    General: Normal vulva.     Exam position: Lithotomy position.     Pubic Area: No rash or pubic lice.      Labia:        Right: No rash or lesion.        Left: No rash or lesion.      Vagina: Normal. No vaginal discharge, erythema, bleeding or lesions.     Cervix: No cervical motion tenderness, discharge, friability, lesion or erythema.     Uterus: Normal.      Adnexa: Right adnexa normal and left adnexa normal.     Rectum: Normal.     Comments:  pH = 5  Strawberry cervix  Mild amt of white-yellow discharge present Lymphadenopathy:     Head:     Right side of head: No preauricular or posterior auricular adenopathy.     Left side of head: No preauricular or posterior auricular adenopathy.     Cervical: No cervical adenopathy.     Upper Body:     Right upper body: No supraclavicular, axillary or epitrochlear adenopathy.     Left upper body: No supraclavicular, axillary or epitrochlear adenopathy.     Lower Body: No right inguinal adenopathy. No left inguinal adenopathy.  Skin:    General: Skin is warm and dry.     Findings: No rash.  Neurological:     Mental Status: She is alert and oriented to person, place,  and time.      Assessment and Plan:  Amy Ware is a 27 y.o. female presenting to the [facility] Department for STI screening  1. Screening for venereal disease  - WET PREP FOR TRICH, YEAST, CLUE - HIV [place] [facility] - Chlamydia/Gonorrhea [place] [facility] - Syphilis Serology, [place] [facility]   Patient accepted all screenings including vaginal CT/GC and bloodwork for HIV/RPR, and wet prep. Patient meets criteria for HepB screening? No. Ordered? not applicable Patient meets criteria for HepC screening? No. Ordered? not applicable  Treat wet prep per standing order Discussed time line for [facility] results and that patient will be called with positive results and encouraged patient to call if she had not heard in 2 weeks.  Counseled to return or seek care for continued or worsening symptoms Recommended repeat testing in 3 months with positive results. Recommended condom use with all sex  Patient is currently using *Nexplanon to prevent pregnancy.    Return if symptoms worsen or fail to improve, for STI screening.  No future appointments. Total time spent 20 minutes  Lenice Llamas, [place]

## 2022-11-19 NOTE — Telephone Encounter (Signed)
Explained to patient if she has an active my chart and her preferences are set to receive information, she will receive the information thru my chart. Also advised any previous messages can not be deleted. She questioned why the same message was attached to every lab. Explained that labs are ordered on the same day, they all follow on one order, therefore, the any my chart message sent is attached to every lab. Addressed privacy concerns. Patient states her phone automatically logs her into her accounts/apps. She does not wish to deactivate my chart at this time. Advised she will need to change the settings on her phone for my chart app not to auto login. Chart flagged with FYI-patient requests all test results be communicated via phone.

## 2022-11-19 NOTE — Telephone Encounter (Signed)
Left voicemail to return call. Advised will send my chart message as well with details on how to set preferences for my chart.

## 2022-11-19 NOTE — Telephone Encounter (Signed)
Telephone Advice Record received via fax from [employer]. Caller Chief Complaint:  Patient has privacy concerns. She was seen in February and sees many notes in the portal but has questions about lab notes and why it is necessary to post them in the portal/why she cannot delete them. She wants telephone calls only.

## 2023-09-19 ENCOUNTER — Encounter: Payer: Self-pay | Admitting: Advanced Practice Midwife

## 2023-09-19 ENCOUNTER — Ambulatory Visit: Admitting: Advanced Practice Midwife

## 2023-09-19 VITALS — BP 130/86 | HR 85 | Ht 60.0 in | Wt 261.0 lb

## 2023-09-19 DIAGNOSIS — Z3046 Encounter for surveillance of implantable subdermal contraceptive: Secondary | ICD-10-CM

## 2023-09-19 NOTE — Progress Notes (Signed)
    GYNECOLOGY PROCEDURE NOTE  Patient requests Nexplanon removal due to weight gain since it was inserted. She denies major changes in lifestyle; diet or exercise. She does admit primary beverage is sodas. Has some vigorous activity- not sustained. Encouraged her to discontinue sodas and to increase physical activity.   Nexplanon removal discussed in detail.  Risks of infection, bleeding, nerve injury all reviewed.  Patient understands risks and desires to proceed.  Verbal consent obtained.  Patient is certain she wants the Nexplanon removed.  All questions answered.  Review of Systems  Constitutional:  Negative for chills and fever.       Positive for weight gain  HENT:  Negative for congestion, ear discharge, ear pain, hearing loss, sinus pain and sore throat.   Eyes:  Negative for blurred vision and double vision.  Respiratory:  Negative for cough, shortness of breath and wheezing.   Cardiovascular:  Negative for chest pain, palpitations and leg swelling.  Gastrointestinal:  Negative for abdominal pain, blood in stool, constipation, diarrhea, heartburn, melena, nausea and vomiting.  Genitourinary:  Negative for dysuria, flank pain, frequency, hematuria and urgency.  Musculoskeletal:  Negative for back pain, joint pain and myalgias.  Skin:  Negative for itching and rash.  Neurological:  Negative for dizziness, tingling, tremors, sensory change, speech change, focal weakness, seizures, loss of consciousness, weakness and headaches.  Endo/Heme/Allergies:  Negative for environmental allergies. Does not bruise/bleed easily.  Psychiatric/Behavioral:  Negative for depression, hallucinations, memory loss, substance abuse and suicidal ideas. The patient is not nervous/anxious and does not have insomnia.     Vital Signs: BP 130/86   Pulse 85   Ht 5' (1.524 m)   Wt 261 lb (118.4 kg)   BMI 50.97 kg/m  Constitutional: Well nourished, well developed female in no acute distress.  Skin: Warm and  dry.  Cardiovascular: Regular rate and rhythm.   Extremity:  no edema   Respiratory:  Normal respiratory effort Psych: Alert and Oriented x3. No memory deficits. Normal mood and affect.    Procedure: Patient placed in dorsal supine with left arm above head, elbow flexed at 90 degrees, arm resting on examination table.  Nexplanon identified without problems.  Betadine scrub x3.  1 ml of 1% lidocaine injected under Nexplanon device without problems.  Sterile gloves applied.  Small 0.5cm incision made at distal tip of Nexplanon device with 11 blade scalpel.  Nexplanon brought to incision and grasped with a small kelly clamp.  Nexplanon removed intact without problems.  Pressure applied to incision.  Hemostasis obtained.  Steri-strips applied, followed by bandage and compression dressing.  Patient tolerated procedure well.  No complications.   Assessment: 28 y.o. year old female now s/p uncomplicated Nexplanon removal.  Plan: 1.  Patient given post procedure precautions and asked to call for fever, chills, redness or drainage from her incision, bleeding from incision.  She understands she will likely have a small bruise near site of removal and can remove bandage tomorrow and steri-strips in approximately 1 week.  2) Contraception: plans to use condoms   Tresea Mall, CNM [facility] 09/19/2023 11:02 AM   J2001 for lidocaine block, 11982 for nexplanon removal

## 2023-10-21 ENCOUNTER — Telehealth: Payer: Self-pay

## 2023-10-21 DIAGNOSIS — Z3200 Encounter for pregnancy test, result unknown: Secondary | ICD-10-CM

## 2023-10-21 NOTE — Telephone Encounter (Signed)
 Pt had Nexplanon  removed 09/19/23 due to weight gain. She said she wanted to be without BC for a little while then get back on birth control. When she had Nexplanon  she had monthly cycles. February's cycle was late and hasn't had one since then. Took a pregnancy test at home 4 days ago, negative. Should she be concerned? Nexplanon  was due for removal 02/17/2024. Said she's scared about not having her March/April cycle because last time she had removed her Nexplanon  she became pregnant immediately and the home UPT's we are all negative. Could this be case again? She does not desire to be pregnant now. Should she wait a little longer for period to come or get blood work done?

## 2023-10-27 ENCOUNTER — Encounter: Payer: Self-pay | Admitting: Advanced Practice Midwife

## 2023-10-29 NOTE — Telephone Encounter (Signed)
 Called pt to follow up on Jane's msg since it was not read. Pt wants to come in for beta. Order in and transferred to front desk to schedule lab appt.

## 2023-10-31 ENCOUNTER — Other Ambulatory Visit

## 2023-10-31 ENCOUNTER — Encounter: Payer: Self-pay | Admitting: Advanced Practice Midwife

## 2023-10-31 DIAGNOSIS — Z3200 Encounter for pregnancy test, result unknown: Secondary | ICD-10-CM

## 2023-11-01 LAB — BETA HCG QUANT (REF LAB): hCG Quant: <1 m[IU]/mL

## 2023-11-08 ENCOUNTER — Encounter: Payer: Self-pay | Admitting: Advanced Practice Midwife

## 2024-03-09 ENCOUNTER — Ambulatory Visit
Admission: RE | Admit: 2024-03-09 | Discharge: 2024-03-09 | Disposition: A | Discharge status: Home / Self Care | Payer: Self-pay | Source: Ambulatory Visit | Attending: Emergency Medicine | Admitting: Emergency Medicine

## 2024-03-09 VITALS — BP 97/60 | HR 65 | Temp 98.8°F | Resp 18

## 2024-03-09 DIAGNOSIS — U071 COVID-19: Secondary | ICD-10-CM

## 2024-03-09 DIAGNOSIS — J029 Acute pharyngitis, unspecified: Secondary | ICD-10-CM | POA: Diagnosis not present

## 2024-03-09 LAB — POCT RAPID STREP A (OFFICE): Rapid Strep A Screen: NEGATIVE

## 2024-03-09 LAB — POC SOFIA SARS ANTIGEN FIA: SARS Coronavirus 2 Ag: POSITIVE — AB

## 2024-03-09 MED ORDER — BENZONATATE 100 MG PO CAPS: 100.0000 mg | ORAL_CAPSULE | Freq: Three times a day (TID) | ORAL | 0 refills | Status: AC

## 2024-03-09 MED ORDER — PROMETHAZINE-DM 6.25-15 MG/5ML PO SYRP: 5.0000 mL | ORAL_SOLUTION | Freq: Four times a day (QID) | ORAL | 0 refills | Status: AC | PRN

## 2024-03-09 NOTE — Discharge Instructions (Signed)
 Covid 19 is a virus and should steadily improve in time it can take up to 7 to 10 days before you truly start to see a turnaround however things will get better    Per the CDC you will need to quarantine and to your 24 hours without fever, if no fever may continue activity wearing mask  May use Tessalon  pill every 8 hours as needed for cough, may use cough syrup every 6 hours for additional comfort but be mindful this can make you feel sleepy  You can take Tylenol  and/or Ibuprofen  as needed for fever reduction and pain relief.   For cough: honey 1/2 to 1 teaspoon (you can dilute the honey in water or another fluid).  You can also use guaifenesin and dextromethorphan for cough. You can use a humidifier for chest congestion and cough.  If you don't have a humidifier, you can sit in the bathroom with the hot shower running.      For sore throat: try warm salt water gargles, cepacol lozenges, throat spray, warm tea or water with lemon/honey, popsicles or ice, or OTC cold relief medicine for throat discomfort.   For congestion: take a daily anti-histamine like Zyrtec, Claritin, and a oral decongestant, such as pseudoephedrine.  You can also use Flonase 1-2 sprays in each nostril daily.   It is important to stay hydrated: drink plenty of fluids (water, gatorade/powerade/pedialyte, juices, or teas) to keep your throat moisturized and help further relieve irritation/discomfort.

## 2024-03-09 NOTE — ED Triage Notes (Signed)
 Patient complains of sore throat, nasal congestion with green sputum, cough and sneezing x 3 days. Patient reports taking dayquil/Nyquil with some improvement. Rates pain 7/10.

## 2024-03-09 NOTE — ED Provider Notes (Signed)
 Amy Ware    CSN: [id number] Arrival date & time: 03/09/24  9160      History   Chief Complaint Chief Complaint  Patient presents with   Sore Throat    I have been sneezing coughing and throat hurts - Entered by patient   Cough   Nasal Congestion    HPI Amy Ware is a 28 y.o. female.   Patient presents for evaluation of nasal congestion, sore throat, productive cough with green sputum, intermittent headaches, sinus pressure and diarrhea present for 3 days.  Tolerable to food and liquids.  Has attempted use of DayQuil.  Known sick contact with exposure to COVID-19.  Denies respiratory history, daily tobacco use.  Past Medical History:  Diagnosis Date   Headache    Low-lying placenta 08/04/2017   Morbid obesity (HCC)    Pyelonephritis 07/2016    There are no active problems to display for this patient.   Past Surgical History:  Procedure Laterality Date   NO PAST SURGERIES      OB History     Gravida  2   Para  2   Term  2   Preterm      AB      Living  2      SAB      IAB      Ectopic      Multiple      Live Births  1            Home Medications    Prior to Admission medications   Medication Sig Start Date End Date Taking? Authorizing Provider  benzonatate  (TESSALON ) 100 MG capsule Take 1 capsule (100 mg total) by mouth every 8 (eight) hours. 03/09/24  Yes Brendaly Townsel, Shelba SAUNDERS, NP  promethazine -dextromethorphan (PROMETHAZINE -DM) 6.25-15 MG/5ML syrup Take 5 mLs by mouth 4 (four) times daily as needed. 03/09/24  Yes Warnell Rasnic R, NP  etonogestrel  (NEXPLANON ) 68 MG IMPL implant 1 each by Subdermal route once.    [provider]    Family History Family History  Problem Relation Age of Onset   Healthy Mother    Healthy Father    Diabetes Maternal Uncle    Breast cancer Maternal Grandmother        early years    Social History Social History   Tobacco Use   Smoking status: Every Day    Types: E-cigarettes    Smokeless tobacco: Never  Vaping Use   Vaping status: Every Day   Start date: 05/27/2019   Substances: Nicotine, Flavoring  Substance Use Topics   Alcohol use: Yes    Comment: social   Drug use: No     Allergies   Patient has no known allergies.   Review of Systems Review of Systems   Physical Exam Triage Vital Signs ED Triage Vitals  Encounter Vitals Group     BP 03/09/24 0851 97/60     Girls Systolic BP Percentile --      Girls Diastolic BP Percentile --      Boys Systolic BP Percentile --      Boys Diastolic BP Percentile --      Pulse Rate 03/09/24 0851 65     Resp 03/09/24 0851 18     Temp 03/09/24 0851 98.8 F (37.1 C)     Temp Source 03/09/24 0851 Oral     SpO2 03/09/24 0851 99 %     Weight --      Height --  Head Circumference --      Peak Flow --      Pain Score 03/09/24 0846 7     Pain Loc --      Pain Education --      Exclude from Growth Chart --    No data found.  Updated Vital Signs BP 97/60 (BP Location: Left Arm)   Pulse 65   Temp 98.8 F (37.1 C) (Oral)   Resp 18   LMP 02/14/2024 (Approximate)   SpO2 99%   Visual Acuity Right Eye Distance:   Left Eye Distance:   Bilateral Distance:    Right Eye Near:   Left Eye Near:    Bilateral Near:     Physical Exam Constitutional:      Appearance: Normal appearance.  HENT:     Head: Normocephalic.     Right Ear: Tympanic membrane, ear canal and external ear normal.     Left Ear: Tympanic membrane, ear canal and external ear normal.     Nose: Congestion present.     Mouth/Throat:     Mouth: Mucous membranes are moist.     Pharynx: Oropharynx is clear. Posterior oropharyngeal erythema present.  Eyes:     Extraocular Movements: Extraocular movements intact.  Cardiovascular:     Rate and Rhythm: Normal rate and regular rhythm.     Pulses: Normal pulses.     Heart sounds: Normal heart sounds.  Pulmonary:     Effort: Pulmonary effort is normal.     Breath sounds: Normal breath  sounds.  Musculoskeletal:     Cervical back: Normal range of motion and neck supple.  Neurological:     Mental Status: She is alert and oriented to person, place, and time.      UC Treatments / Results  Labs (all labs ordered are listed, but only abnormal results are displayed) Labs Reviewed  POC SOFIA SARS ANTIGEN FIA - Abnormal; Notable for the following components:      Result Value   SARS Coronavirus 2 Ag Positive (*)    All other components within normal limits  POCT RAPID STREP A (OFFICE) - Normal    EKG   Radiology No results found.  Procedures Procedures (including critical care time)  Medications Ordered in UC Medications - No data to display  Initial Impression / Assessment and Plan / UC Course  I have reviewed the triage vital signs and the nursing notes.  Pertinent labs & imaging results that were available during my care of the patient were reviewed by me and considered in my medical decision making (see chart for details).  COVID-19, sore throat  Patient is in no signs of distress nor toxic appearing.  Vital signs are stable.  Low suspicion for pneumonia, pneumothorax or bronchitis and therefore will defer imaging.  Discussed quarantine per the CDC.  Rapid strep test negative.  Prescribed Tessalon  and Promethazine  DM. May use additional over-the-counter medications as needed for supportive care.  May follow-up with urgent care as needed if symptoms persist or worsen.  Note given.   Final Clinical Impressions(s) / UC Diagnoses   Final diagnoses:  Sore throat     Discharge Instructions      Covid 19 is a virus and should steadily improve in time it can take up to 7 to 10 days before you truly start to see a turnaround however things will get better    Per the CDC you will need to quarantine and to your 24 hours without fever,  if no fever may continue activity wearing mask  May use Tessalon  pill every 8 hours as needed for cough, may use cough syrup  every 6 hours for additional comfort but be mindful this can make you feel sleepy  You can take Tylenol  and/or Ibuprofen  as needed for fever reduction and pain relief.   For cough: honey 1/2 to 1 teaspoon (you can dilute the honey in water or another fluid).  You can also use guaifenesin and dextromethorphan for cough. You can use a humidifier for chest congestion and cough.  If you don't have a humidifier, you can sit in the bathroom with the hot shower running.      For sore throat: try warm salt water gargles, cepacol lozenges, throat spray, warm tea or water with lemon/honey, popsicles or ice, or OTC cold relief medicine for throat discomfort.   For congestion: take a daily anti-histamine like Zyrtec, Claritin, and a oral decongestant, such as pseudoephedrine.  You can also use Flonase 1-2 sprays in each nostril daily.   It is important to stay hydrated: drink plenty of fluids (water, gatorade/powerade/pedialyte, juices, or teas) to keep your throat moisturized and help further relieve irritation/discomfort.    ED Prescriptions     Medication Sig Dispense Auth. Provider   benzonatate  (TESSALON ) 100 MG capsule Take 1 capsule (100 mg total) by mouth every 8 (eight) hours. 21 capsule Aulden Calise R, NP   promethazine -dextromethorphan (PROMETHAZINE -DM) 6.25-15 MG/5ML syrup Take 5 mLs by mouth 4 (four) times daily as needed. 118 mL Millard Bautch, Shelba SAUNDERS, NP      PDMP not reviewed this encounter.   Teresa Shelba SAUNDERS, [place] 03/09/24 [id number]
